# Patient Record
Sex: Female | Born: 1935 | Race: White | Hispanic: No | Smoking: Never smoker
Health system: Southern US, Community
[De-identification: ages and names within clinical notes are randomized; demographics above are authoritative.]

## PROBLEM LIST (undated history)

## (undated) DIAGNOSIS — F419 Anxiety disorder, unspecified: Secondary | ICD-10-CM

## (undated) DIAGNOSIS — I10 Essential (primary) hypertension: Secondary | ICD-10-CM

## (undated) HISTORY — PX: BACK SURGERY: SHX140

## (undated) HISTORY — PX: ABDOMINAL HYSTERECTOMY: SHX81

---

## 2010-11-26 ENCOUNTER — Encounter (HOSPITAL_COMMUNITY)
Admission: RE | Admit: 2010-11-26 | Discharge: 2010-11-26 | Disposition: A | Discharge status: Home / Self Care | Payer: Medicare Other | Source: Ambulatory Visit | Attending: Neurosurgery | Admitting: Neurosurgery

## 2010-11-26 ENCOUNTER — Other Ambulatory Visit: Payer: Self-pay | Admitting: Neurosurgery

## 2010-11-26 ENCOUNTER — Ambulatory Visit (HOSPITAL_COMMUNITY)
Admission: RE | Admit: 2010-11-26 | Discharge: 2010-11-26 | Disposition: A | Discharge status: Home / Self Care | Payer: Medicare Other | Source: Ambulatory Visit | Attending: Neurosurgery | Admitting: Neurosurgery

## 2010-11-26 DIAGNOSIS — Z01818 Encounter for other preprocedural examination: Secondary | ICD-10-CM | POA: Insufficient documentation

## 2010-11-26 DIAGNOSIS — I10 Essential (primary) hypertension: Secondary | ICD-10-CM

## 2010-11-26 DIAGNOSIS — Z01812 Encounter for preprocedural laboratory examination: Secondary | ICD-10-CM | POA: Insufficient documentation

## 2010-11-26 DIAGNOSIS — Z01810 Encounter for preprocedural cardiovascular examination: Secondary | ICD-10-CM | POA: Insufficient documentation

## 2010-11-26 LAB — CBC
MCH: 28.6 pg (ref 26.0–34.0)
Platelets: 221 10*3/uL (ref 150–400)
RBC: 4.37 MIL/uL (ref 3.87–5.11)
WBC: 9.2 10*3/uL (ref 4.0–10.5)

## 2010-11-26 LAB — BASIC METABOLIC PANEL
CO2: 31 mEq/L (ref 19–32)
Calcium: 9.5 mg/dL (ref 8.4–10.5)
Sodium: 142 mEq/L (ref 135–145)

## 2010-11-26 LAB — SURGICAL PCR SCREEN
MRSA, PCR: NEGATIVE
Staphylococcus aureus: POSITIVE — AB

## 2010-11-27 ENCOUNTER — Inpatient Hospital Stay (HOSPITAL_COMMUNITY): Admission: RE | Admit: 2010-11-27 | Payer: Medicare Other | Source: Ambulatory Visit | Admitting: Neurosurgery

## 2010-12-06 ENCOUNTER — Other Ambulatory Visit (HOSPITAL_COMMUNITY): Payer: Medicare Other

## 2010-12-12 ENCOUNTER — Inpatient Hospital Stay (HOSPITAL_COMMUNITY)
Admission: RE | Admit: 2010-12-12 | Discharge: 2010-12-21 | DRG: 460 | Disposition: A | Discharge status: Skilled Nursing Facility | Payer: Medicare Other | Source: Ambulatory Visit | Attending: Neurosurgery | Admitting: Neurosurgery

## 2010-12-12 ENCOUNTER — Inpatient Hospital Stay (HOSPITAL_COMMUNITY): Payer: Medicare Other

## 2010-12-12 DIAGNOSIS — I10 Essential (primary) hypertension: Secondary | ICD-10-CM | POA: Diagnosis present

## 2010-12-12 DIAGNOSIS — Q762 Congenital spondylolisthesis: Secondary | ICD-10-CM

## 2010-12-12 DIAGNOSIS — G8929 Other chronic pain: Secondary | ICD-10-CM | POA: Diagnosis present

## 2010-12-12 DIAGNOSIS — E669 Obesity, unspecified: Secondary | ICD-10-CM | POA: Diagnosis present

## 2010-12-12 DIAGNOSIS — Z01810 Encounter for preprocedural cardiovascular examination: Secondary | ICD-10-CM

## 2010-12-12 DIAGNOSIS — M5137 Other intervertebral disc degeneration, lumbosacral region: Principal | ICD-10-CM | POA: Diagnosis present

## 2010-12-12 DIAGNOSIS — J449 Chronic obstructive pulmonary disease, unspecified: Secondary | ICD-10-CM | POA: Diagnosis present

## 2010-12-12 DIAGNOSIS — F341 Dysthymic disorder: Secondary | ICD-10-CM | POA: Diagnosis present

## 2010-12-12 DIAGNOSIS — M51379 Other intervertebral disc degeneration, lumbosacral region without mention of lumbar back pain or lower extremity pain: Principal | ICD-10-CM | POA: Diagnosis present

## 2010-12-12 DIAGNOSIS — R339 Retention of urine, unspecified: Secondary | ICD-10-CM | POA: Diagnosis not present

## 2010-12-12 DIAGNOSIS — Z01812 Encounter for preprocedural laboratory examination: Secondary | ICD-10-CM

## 2010-12-12 DIAGNOSIS — Z79899 Other long term (current) drug therapy: Secondary | ICD-10-CM

## 2010-12-12 DIAGNOSIS — J4489 Other specified chronic obstructive pulmonary disease: Secondary | ICD-10-CM | POA: Diagnosis present

## 2010-12-12 DIAGNOSIS — Z880 Allergy status to penicillin: Secondary | ICD-10-CM

## 2010-12-12 DIAGNOSIS — Z87891 Personal history of nicotine dependence: Secondary | ICD-10-CM

## 2010-12-12 LAB — BASIC METABOLIC PANEL
BUN: 18 mg/dL (ref 6–23)
Creatinine, Ser: 0.63 mg/dL (ref 0.50–1.10)
GFR calc Af Amer: >60 mL/min (ref >60)
GFR calc non Af Amer: >60 mL/min (ref >60)
Glucose, Bld: 132 mg/dL — ABNORMAL HIGH (ref 70–99)
Potassium: 4 mEq/L (ref 3.5–5.1)

## 2010-12-12 LAB — CBC
HCT: 40.4 % (ref 36.0–46.0)
Hemoglobin: 13.4 g/dL (ref 12.0–15.0)
MCH: 29.4 pg (ref 26.0–34.0)
MCHC: 33.2 g/dL (ref 30.0–36.0)
MCV: 88.6 fL (ref 78.0–100.0)
RDW: 13.7 % (ref 11.5–15.5)

## 2010-12-12 LAB — TYPE AND SCREEN

## 2010-12-12 LAB — ABO/RH: ABO/RH(D): A POS

## 2010-12-14 DIAGNOSIS — IMO0002 Reserved for concepts with insufficient information to code with codable children: Secondary | ICD-10-CM

## 2010-12-14 DIAGNOSIS — M47817 Spondylosis without myelopathy or radiculopathy, lumbosacral region: Secondary | ICD-10-CM

## 2010-12-14 NOTE — Op Note (Signed)
Allison Macdonald, RIDGELY NO.:  [id number]  MEDICAL RECORD NO.:  [id number]  LOCATION:  3013                         FACILITY:  [facility]  PHYSICIAN:  Hilda Lias, M.D.   DATE OF BIRTH:  01-Nov-1926  DATE OF PROCEDURE:  12/12/2010 DATE OF DISCHARGE:                              OPERATIVE REPORT   PREOPERATIVE DIAGNOSES:  L4-L5, L5-S1 degenerative disk disease, spondylolisthesis at L4-L5, L5-S1, chronic radiculopathy, facet arthropathy.  POSTOPERATIVE DIAGNOSES:  L4-L5, L5-S1 degenerative disk disease, spondylolisthesis at L4-L5, L5-S1, chronic radiculopathy, facet arthropathy.  PROCEDURE:  Bilateral L4-L5 laminectomy and facetectomy, bilateral 4-5, 5-1 diskectomy.  Procedure was more aggressive and normal diskectomy. Interbody fusion with cages 12 x 22 at the level of 5-1 and 14 x 22 at L4-5, pedicle screws at L4, L5, S1, posterolateral arthrodesis and Vitoss plus autograft.  Cell Saver C-arm.  SURGEON:  Hilda Lias, MD  ASSISTANT:  Cristi Loron, MD  CLINICAL HISTORY:  The patient was admitted because of back pain with radiation of both legs, which had been going for many years.  Right now, she is up to the point of when she was like 4 days ago.  She had to sit because of the pain becoming tense.  X-rays showed that she has spondylolisthesis with stenosis at the level of 4-5, 5-1.  Surgery was advised.  The risk was explained prior to surgery.  PROCEDURE:  The patient was taken to the OR and after intubation, she was positioned in a prone manner.  The back was cleaned with DuraPrep. Drapes were applied.  Midline incision from L3-L4 to L5-S1 was made and muscles were retracted laterally.  We identified by x-ray, L4-L5 and L5- S1 space.  Then with the Leksell, we proceeded to 4-5 as well as the lamina.  To go into the disk space as well as total diskectomy, we removed all the way the facet of L4 and L5.  The patient had quite adhesion, lysis was  accomplished.  We entered the disk space at the L4-5 first in the right side and then the left side.  Total gross diskectomy was accomplished with removal of the endplate.  The same procedure was done at the level of 5-1.  Once this was achieved, we introduced at the level of L4-5 two cages with autograft inside.  At this level, the cage was 14 x 22 and at the L5-1 the cages were 12 x 22.  X-rays showed good position of the cages.  Then using the C-arm first in AP view and then a lateral view, we made a hole in the pedicle of L4, L5, and S1.  Prior to introducing the screws, we investigated all 4 quadrants yet to be sure that we were surrounded by bone.  Once this was achieved, we introduced six screws.  5.5 x 40 at the level of 4-5 and 5.5 x 45 at the level of S1.  X-rays showed good position of the pedicle screws.  We investigated prior to continuing with the fusion.  We investigated the L4, L5, S1 nerve roots as there was plenty of space and there was no penetration of the middle wall of the pedicles.  Then a rod was brought and first screw placed, the screw was secured with Capps.  Then a cross-link from right to left rod was used.  We went laterally and we removed the periosteum of 3-4, 4-5, and 5-1 and a mix of Vitoss and autograft was used for arthrodesis.  From then on, Valsalva maneuver was negative. The area was irrigated.  We went back again to check the foramen and there was plenty of space.  Then the wound was closed with Vicryl and Steri-Strips.          ______________________________ Hilda Lias, M.D.     EB/MEDQ  D:  12/12/2010  T:  12/12/2010  Job:  [id number]  Electronically Signed by Hilda Lias M.D. on 12/14/2010 06:22:40 PM

## 2010-12-15 ENCOUNTER — Inpatient Hospital Stay (HOSPITAL_COMMUNITY): Payer: Medicare Other

## 2010-12-16 LAB — BASIC METABOLIC PANEL
BUN: 22 mg/dL (ref 6–23)
CO2: 33 mEq/L — ABNORMAL HIGH (ref 19–32)
Chloride: 94 mEq/L — ABNORMAL LOW (ref 96–112)
Creatinine, Ser: 0.71 mg/dL (ref 0.50–1.10)
GFR calc Af Amer: >60 mL/min (ref >60)
Potassium: 3.6 mEq/L (ref 3.5–5.1)

## 2010-12-17 LAB — URINALYSIS, ROUTINE W REFLEX MICROSCOPIC
Glucose, UA: NEGATIVE mg/dL
Ketones, ur: NEGATIVE mg/dL
Leukocytes, UA: NEGATIVE
Nitrite: NEGATIVE
Specific Gravity, Urine: 1.013 (ref 1.005–1.030)
pH: 6.5 (ref 5.0–8.0)

## 2010-12-26 NOTE — Discharge Summary (Signed)
  [name], HOOK               ACCOUNT NO.:  [id number]  MEDICAL RECORD NO.:  [id number]  LOCATION:  3013                         FACILITY:  [facility]  PHYSICIAN:  Hilda Lias, M.D.   DATE OF BIRTH:  1926-09-30  DATE OF ADMISSION:  12/12/2010 DATE OF DISCHARGE:  12/21/2010                              DISCHARGE SUMMARY   ADMISSION DIAGNOSIS:  Degenerative disk disease, L4-L5, L5-S1 with spondylolisthesis and chronic radiculopathy.  FINAL DIAGNOSIS:  Degenerative disk disease, L4-L5, L5-S1 with spondylolisthesis and chronic radiculopathy.  CLINICAL HISTORY:  The patient was admitted because of back pain with radiation to both legs.  She has failed with the conservative treatment. X-rays showed degenerative disk disease with spondylolisthesis at the level of L4-L5 and L5-S1.  Laboratory at the present time within normal limits.  COURSE IN THE HOSPITAL:  The patient was taken to Surgery and L4-L5 laminectomy with fusion using cages and pedicle screws was done.  After surgery, the patient complained of quite a bit of pain.  She was quite sedated and we had to use intravenous morphine and Dilaudid. Nevertheless, from the past 2 days she had been taking Arthrotac and she is feeling much better.  She is ambulating today.  She is more awake and she is willing to go to a nursing home because she is fully aware that she can ambulate by herself at home.  CONDITION ON DISCHARGE:  Improvement.  MEDICATIONS:  She will continue taking the Percocet for pain and all previous medications from home.  DIET:  Regular.  ACTIVITY:  Not to drive and not do any lifting.  FOLLOWUP:  She will be seen by me in my office in 4 weeks or before as needed.          ______________________________ Hilda Lias, M.D.     EB/MEDQ  D:  12/21/2010  T:  12/21/2010  Job:  [id number]  Electronically Signed by Hilda Lias M.D. on 12/26/2010 07:05:29 PM

## 2012-05-31 IMAGING — CR DG LUMBAR SPINE 1V
1 series · 1 of 1 positions shown · non-contrast
Comparison: None.

CLINICAL DATA: 83-year-old female undergoing lumbar surgery.

LUMBAR SPINE - 1 VIEW

[view not recorded]
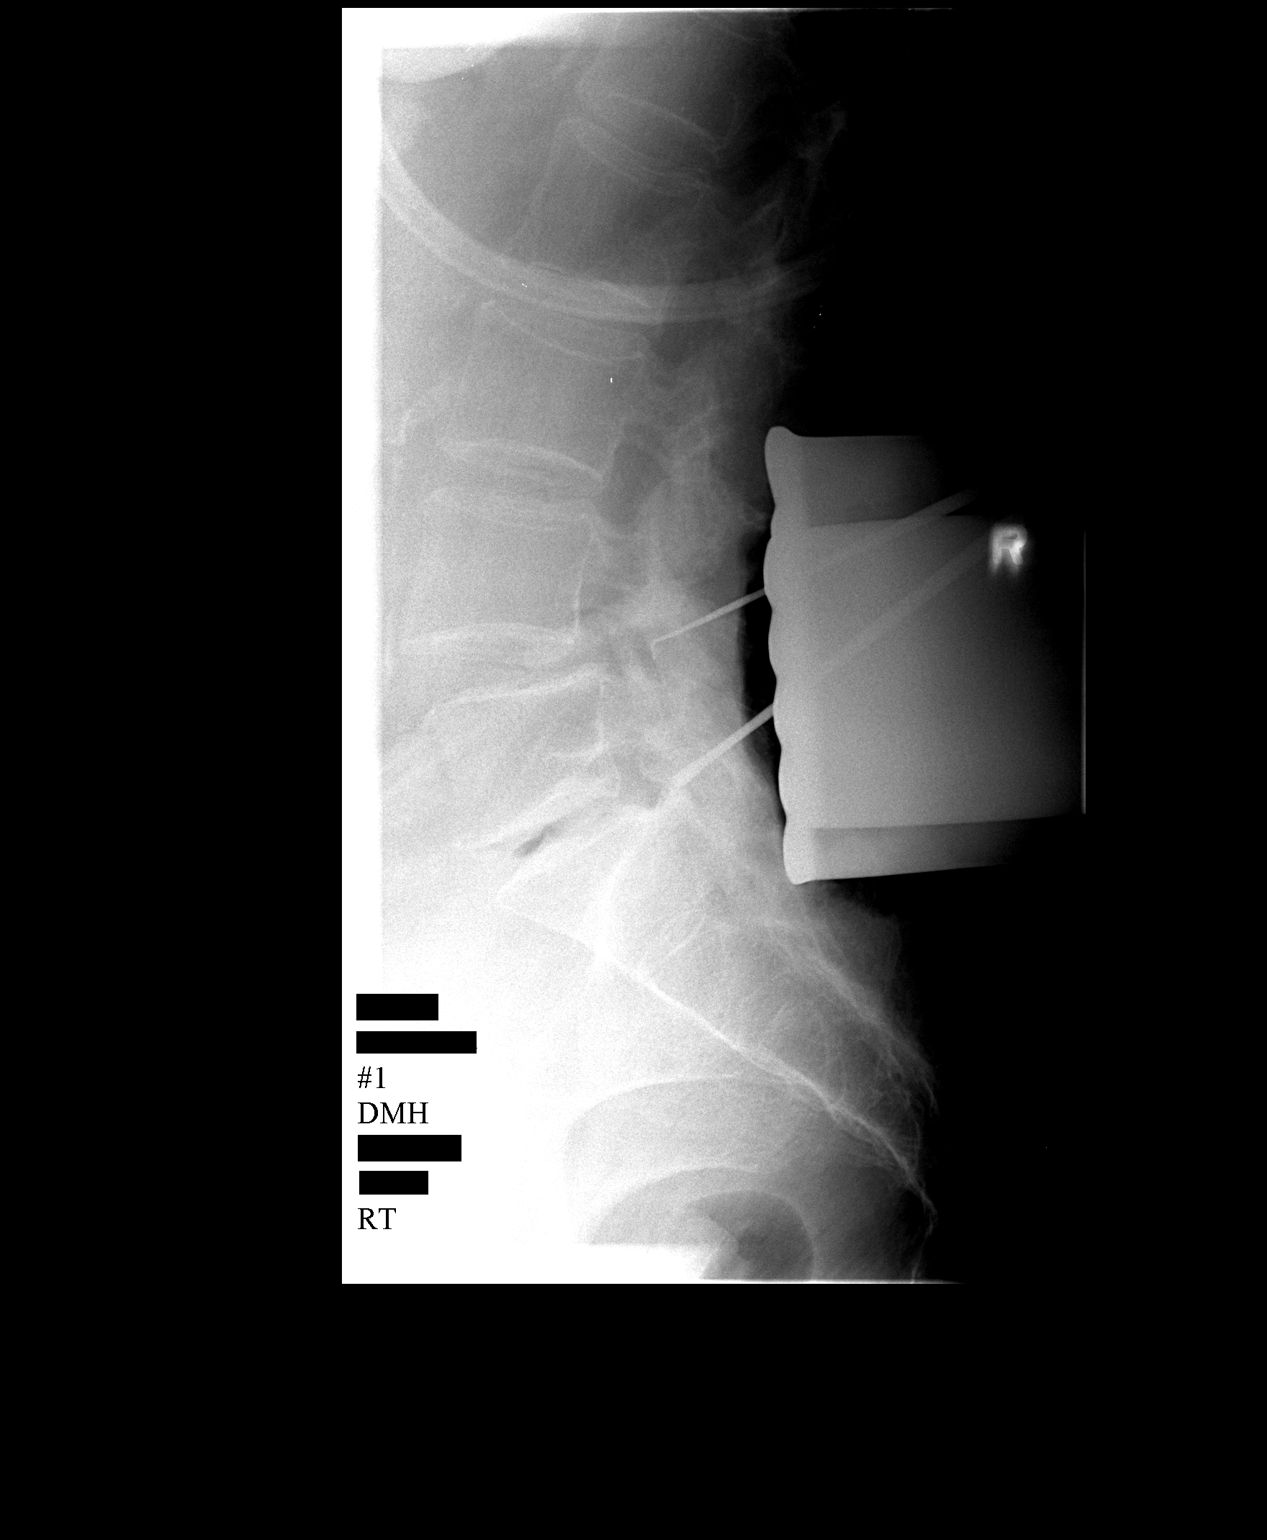

[1 of 1 positions shown; findings below may reference images not displayed]

FINDINGS: Portable cross-table lateral intraoperative view of the
lumbar spine labeled film #1 at 0770 hours.

Assuming normal lumbar segmentation, a surgical probes are present
at the L4-L5 and L5-S1 disc spaces. This designates a grade 1
anterolisthesis at the former and extensive vacuum disc phenomena
at the latter.
IMPRESSION: Intraoperative localization as above.

## 2014-09-26 ENCOUNTER — Inpatient Hospital Stay (HOSPITAL_COMMUNITY)
Admission: EM | Admit: 2014-09-26 | Discharge: 2014-09-30 | DRG: 064 | Disposition: A | Discharge status: Home Health | Payer: Medicare Other | Attending: Internal Medicine | Admitting: Internal Medicine

## 2014-09-26 ENCOUNTER — Other Ambulatory Visit (HOSPITAL_COMMUNITY): Payer: Self-pay

## 2014-09-26 ENCOUNTER — Emergency Department (HOSPITAL_COMMUNITY): Payer: Medicare Other

## 2014-09-26 ENCOUNTER — Encounter (HOSPITAL_COMMUNITY): Payer: Self-pay | Admitting: *Deleted

## 2014-09-26 ENCOUNTER — Inpatient Hospital Stay (HOSPITAL_COMMUNITY): Payer: Medicare Other

## 2014-09-26 DIAGNOSIS — G8929 Other chronic pain: Secondary | ICD-10-CM | POA: Diagnosis present

## 2014-09-26 DIAGNOSIS — I63531 Cerebral infarction due to unspecified occlusion or stenosis of right posterior cerebral artery: Secondary | ICD-10-CM | POA: Diagnosis present

## 2014-09-26 DIAGNOSIS — Z7982 Long term (current) use of aspirin: Secondary | ICD-10-CM

## 2014-09-26 DIAGNOSIS — M549 Dorsalgia, unspecified: Secondary | ICD-10-CM | POA: Diagnosis present

## 2014-09-26 DIAGNOSIS — I5181 Takotsubo syndrome: Secondary | ICD-10-CM

## 2014-09-26 DIAGNOSIS — K5900 Constipation, unspecified: Secondary | ICD-10-CM | POA: Diagnosis not present

## 2014-09-26 DIAGNOSIS — R778 Other specified abnormalities of plasma proteins: Secondary | ICD-10-CM | POA: Diagnosis present

## 2014-09-26 DIAGNOSIS — E669 Obesity, unspecified: Secondary | ICD-10-CM | POA: Diagnosis present

## 2014-09-26 DIAGNOSIS — I4891 Unspecified atrial fibrillation: Secondary | ICD-10-CM | POA: Diagnosis present

## 2014-09-26 DIAGNOSIS — R531 Weakness: Secondary | ICD-10-CM | POA: Diagnosis present

## 2014-09-26 DIAGNOSIS — F0390 Unspecified dementia without behavioral disturbance: Secondary | ICD-10-CM | POA: Diagnosis present

## 2014-09-26 DIAGNOSIS — I639 Cerebral infarction, unspecified: Secondary | ICD-10-CM | POA: Diagnosis not present

## 2014-09-26 DIAGNOSIS — R110 Nausea: Secondary | ICD-10-CM | POA: Diagnosis not present

## 2014-09-26 DIAGNOSIS — J9601 Acute respiratory failure with hypoxia: Secondary | ICD-10-CM | POA: Diagnosis not present

## 2014-09-26 DIAGNOSIS — R0600 Dyspnea, unspecified: Secondary | ICD-10-CM

## 2014-09-26 DIAGNOSIS — W01198A Fall on same level from slipping, tripping and stumbling with subsequent striking against other object, initial encounter: Secondary | ICD-10-CM | POA: Diagnosis present

## 2014-09-26 DIAGNOSIS — R748 Abnormal levels of other serum enzymes: Secondary | ICD-10-CM | POA: Diagnosis present

## 2014-09-26 DIAGNOSIS — F411 Generalized anxiety disorder: Secondary | ICD-10-CM | POA: Diagnosis not present

## 2014-09-26 DIAGNOSIS — I5043 Acute on chronic combined systolic (congestive) and diastolic (congestive) heart failure: Secondary | ICD-10-CM | POA: Diagnosis present

## 2014-09-26 DIAGNOSIS — J441 Chronic obstructive pulmonary disease with (acute) exacerbation: Secondary | ICD-10-CM | POA: Diagnosis not present

## 2014-09-26 DIAGNOSIS — I483 Typical atrial flutter: Secondary | ICD-10-CM | POA: Diagnosis not present

## 2014-09-26 DIAGNOSIS — R7989 Other specified abnormal findings of blood chemistry: Secondary | ICD-10-CM | POA: Diagnosis not present

## 2014-09-26 DIAGNOSIS — J9600 Acute respiratory failure, unspecified whether with hypoxia or hypercapnia: Secondary | ICD-10-CM | POA: Diagnosis present

## 2014-09-26 DIAGNOSIS — Z6831 Body mass index (BMI) 31.0-31.9, adult: Secondary | ICD-10-CM

## 2014-09-26 DIAGNOSIS — R29810 Facial weakness: Secondary | ICD-10-CM | POA: Diagnosis present

## 2014-09-26 DIAGNOSIS — I4892 Unspecified atrial flutter: Secondary | ICD-10-CM | POA: Diagnosis present

## 2014-09-26 DIAGNOSIS — F329 Major depressive disorder, single episode, unspecified: Secondary | ICD-10-CM | POA: Diagnosis present

## 2014-09-26 DIAGNOSIS — R9431 Abnormal electrocardiogram [ECG] [EKG]: Secondary | ICD-10-CM

## 2014-09-26 DIAGNOSIS — E876 Hypokalemia: Secondary | ICD-10-CM | POA: Diagnosis not present

## 2014-09-26 DIAGNOSIS — S0512XA Contusion of eyeball and orbital tissues, left eye, initial encounter: Secondary | ICD-10-CM | POA: Diagnosis present

## 2014-09-26 DIAGNOSIS — I5023 Acute on chronic systolic (congestive) heart failure: Secondary | ICD-10-CM | POA: Clinically undetermined

## 2014-09-26 DIAGNOSIS — I484 Atypical atrial flutter: Secondary | ICD-10-CM | POA: Diagnosis not present

## 2014-09-26 DIAGNOSIS — R1312 Dysphagia, oropharyngeal phase: Secondary | ICD-10-CM | POA: Diagnosis present

## 2014-09-26 DIAGNOSIS — Z880 Allergy status to penicillin: Secondary | ICD-10-CM

## 2014-09-26 DIAGNOSIS — J9811 Atelectasis: Secondary | ICD-10-CM | POA: Diagnosis present

## 2014-09-26 DIAGNOSIS — I509 Heart failure, unspecified: Secondary | ICD-10-CM | POA: Diagnosis not present

## 2014-09-26 DIAGNOSIS — H5340 Unspecified visual field defects: Secondary | ICD-10-CM | POA: Diagnosis present

## 2014-09-26 DIAGNOSIS — I10 Essential (primary) hypertension: Secondary | ICD-10-CM | POA: Diagnosis not present

## 2014-09-26 DIAGNOSIS — R1314 Dysphagia, pharyngoesophageal phase: Secondary | ICD-10-CM | POA: Diagnosis not present

## 2014-09-26 HISTORY — DX: Essential (primary) hypertension: I10

## 2014-09-26 HISTORY — DX: Anxiety disorder, unspecified: F41.9

## 2014-09-26 LAB — COMPREHENSIVE METABOLIC PANEL
ALT: 31 U/L (ref 14–54)
AST: 39 U/L (ref 15–41)
Albumin: 3.9 g/dL (ref 3.5–5.0)
Alkaline Phosphatase: 113 U/L (ref 38–126)
Anion gap: 12 (ref 5–15)
BUN: 20 mg/dL (ref 6–20)
CO2: 23 mmol/L (ref 22–32)
Calcium: 9.1 mg/dL (ref 8.9–10.3)
Chloride: 103 mmol/L (ref 101–111)
Creatinine, Ser: 1.1 mg/dL — ABNORMAL HIGH (ref 0.44–1.00)
GFR calc Af Amer: 52 mL/min — ABNORMAL LOW (ref >60)
GFR calc non Af Amer: 44 mL/min — ABNORMAL LOW (ref >60)
Glucose, Bld: 137 mg/dL — ABNORMAL HIGH (ref 65–99)
Potassium: 4.1 mmol/L (ref 3.5–5.1)
Sodium: 138 mmol/L (ref 135–145)
Total Bilirubin: 1.1 mg/dL (ref 0.3–1.2)
Total Protein: 7.2 g/dL (ref 6.5–8.1)

## 2014-09-26 LAB — DIFFERENTIAL
Basophils Absolute: 0 10*3/uL (ref 0.0–0.1)
Basophils Relative: 0 % (ref 0–1)
Eosinophils Absolute: 0.3 10*3/uL (ref 0.0–0.7)
Eosinophils Relative: 2 % (ref 0–5)
Lymphocytes Relative: 40 % (ref 12–46)
Lymphs Abs: 4.2 10*3/uL — ABNORMAL HIGH (ref 0.7–4.0)
Monocytes Absolute: 0.5 10*3/uL (ref 0.1–1.0)
Monocytes Relative: 5 % (ref 3–12)
Neutro Abs: 5.5 10*3/uL (ref 1.7–7.7)
Neutrophils Relative %: 53 % (ref 43–77)

## 2014-09-26 LAB — CBC
HCT: 39.2 % (ref 36.0–46.0)
Hemoglobin: 12.9 g/dL (ref 12.0–15.0)
MCH: 30.6 pg (ref 26.0–34.0)
MCHC: 32.9 g/dL (ref 30.0–36.0)
MCV: 93.1 fL (ref 78.0–100.0)
Platelets: 229 10*3/uL (ref 150–400)
RBC: 4.21 MIL/uL (ref 3.87–5.11)
RDW: 14.1 % (ref 11.5–15.5)
WBC: 10.5 10*3/uL (ref 4.0–10.5)

## 2014-09-26 LAB — I-STAT CHEM 8, ED
BUN: 22 mg/dL — ABNORMAL HIGH (ref 6–20)
Calcium, Ion: 1.06 mmol/L — ABNORMAL LOW (ref 1.13–1.30)
Chloride: 102 mmol/L (ref 101–111)
Creatinine, Ser: 1 mg/dL (ref 0.44–1.00)
Glucose, Bld: 142 mg/dL — ABNORMAL HIGH (ref 65–99)
HCT: 43 % (ref 36.0–46.0)
Hemoglobin: 14.6 g/dL (ref 12.0–15.0)
Potassium: 4 mmol/L (ref 3.5–5.1)
Sodium: 140 mmol/L (ref 135–145)
TCO2: 23 mmol/L (ref 0–100)

## 2014-09-26 LAB — I-STAT TROPONIN, ED: Troponin i, poc: 0.65 ng/mL (ref 0.00–0.08)

## 2014-09-26 LAB — URINALYSIS, ROUTINE W REFLEX MICROSCOPIC
Glucose, UA: NEGATIVE mg/dL
Ketones, ur: 15 mg/dL — AB
Leukocytes, UA: NEGATIVE
Nitrite: NEGATIVE
Protein, ur: 100 mg/dL — AB
Specific Gravity, Urine: 1.033 — ABNORMAL HIGH (ref 1.005–1.030)
Urobilinogen, UA: 1 mg/dL (ref 0.0–1.0)
pH: 5 (ref 5.0–8.0)

## 2014-09-26 LAB — RAPID URINE DRUG SCREEN, HOSP PERFORMED
Amphetamines: NOT DETECTED
Barbiturates: NOT DETECTED
Benzodiazepines: POSITIVE — AB
Cocaine: NOT DETECTED
Opiates: POSITIVE — AB
Tetrahydrocannabinol: NOT DETECTED

## 2014-09-26 LAB — URINE MICROSCOPIC-ADD ON

## 2014-09-26 LAB — PROTIME-INR
INR: 1.19 (ref 0.00–1.49)
Prothrombin Time: 15.2 seconds (ref 11.6–15.2)

## 2014-09-26 LAB — ETHANOL: Alcohol, Ethyl (B): <5 mg/dL (ref <5)

## 2014-09-26 LAB — APTT: aPTT: 30 seconds (ref 24–37)

## 2014-09-26 LAB — TROPONIN I: TROPONIN I: 0.47 ng/mL — AB (ref <0.031)

## 2014-09-26 LAB — CBG MONITORING, ED: Glucose-Capillary: 146 mg/dL — ABNORMAL HIGH (ref 65–99)

## 2014-09-26 MED ORDER — HEPARIN (PORCINE) IN NACL 100-0.45 UNIT/ML-% IJ SOLN
1200.0000 [IU]/h | INTRAMUSCULAR | Status: DC
  Administered 2014-09-26: 1050 [IU]/h via INTRAVENOUS
  Administered 2014-09-27: 1200 [IU]/h via INTRAVENOUS
  Filled 2014-09-26 (×4): qty 250

## 2014-09-26 MED ORDER — SENNOSIDES-DOCUSATE SODIUM 8.6-50 MG PO TABS: 1.0000 | ORAL_TABLET | Freq: Every evening | ORAL | Status: DC | PRN

## 2014-09-26 MED ORDER — ONDANSETRON HCL 4 MG/2ML IJ SOLN
4.0000 mg | Freq: Once | INTRAMUSCULAR | Status: AC
  Administered 2014-09-26: 4 mg via INTRAVENOUS
  Filled 2014-09-26: qty 2

## 2014-09-26 MED ORDER — ASPIRIN 325 MG PO TABS
325.0000 mg | ORAL_TABLET | Freq: Every day | ORAL | Status: DC
  Administered 2014-09-26 – 2014-09-29 (×3): 325 mg via ORAL
  Filled 2014-09-26 (×3): qty 1

## 2014-09-26 MED ORDER — STROKE: EARLY STAGES OF RECOVERY BOOK
Freq: Once | Status: AC
  Administered 2014-09-26: 22:00:00

## 2014-09-26 MED ORDER — LIDOCAINE HCL (PF) 1 % IJ SOLN: 5.0000 mL | Freq: Once | INTRAMUSCULAR | Status: DC

## 2014-09-26 MED ORDER — ONDANSETRON HCL 4 MG/2ML IJ SOLN
4.0000 mg | INTRAMUSCULAR | Status: DC | PRN
  Administered 2014-09-26 – 2014-09-30 (×6): 4 mg via INTRAVENOUS
  Filled 2014-09-26 (×5): qty 2

## 2014-09-26 MED ORDER — ASPIRIN 81 MG PO CHEW
324.0000 mg | CHEWABLE_TABLET | Freq: Once | ORAL | Status: AC
  Administered 2014-09-26: 324 mg via ORAL
  Filled 2014-09-26: qty 4

## 2014-09-26 MED ORDER — SODIUM CHLORIDE 0.9 % IV SOLN
INTRAVENOUS | Status: AC
  Administered 2014-09-26: 22:00:00 via INTRAVENOUS

## 2014-09-26 MED ORDER — HEPARIN BOLUS VIA INFUSION
4000.0000 [IU] | Freq: Once | INTRAVENOUS | Status: DC
  Filled 2014-09-26: qty 4000

## 2014-09-26 MED ORDER — ASPIRIN 300 MG RE SUPP
300.0000 mg | Freq: Every day | RECTAL | Status: DC
Start: 2014-09-26 — End: 2014-09-29
  Administered 2014-09-27: 300 mg via RECTAL
  Filled 2014-09-26 (×3): qty 1

## 2014-09-26 NOTE — Consult Note (Addendum)
Admit date: 09/26/2014 Referring Physician  Dr. Juleen China Primary Physician No primary care provider on file. Primary Cardiologist  Dr.Kowolski [place] Reason for Consultation  Elevated troponin, abnormal EKG with deep T-wave inversion, left facial droop consistent with stroke  HPI: 81 year old female with hypertension, no prior known cardiovascular history with dementia who presented as code stroke with left facial droop noticed around 5:15 PM not clear when last normal period CT scan of head showed no evidence of bleed. Her son took her in because he thought she was having a stroke. She states that she thinks that she fell asleep with her head on the side of the chair and that may have made it go "numb". Neurology evaluated her when she arrived and mild deficits were visualized. Unaware of onset. She was started on aspirin and heparin. She was not deemed a TPA candidate.  Her EKG demonstrates atrial flutter with deep T-wave inversions especially in the precordial leads, no evidence of ST elevation. T-wave inversions are also seen in the inferolateral leads as well as they're fairly diffuse. The magnitude of T-wave inversions aren't quite significant especially in V3, V4. She is not complaining of any chest discomfort, shortness of breath.  Prior back surgery by Dr. Jeral Fruit in 2012. Has chronic back pain.  Currently asking to go home.    PMH:   Past Medical History  Diagnosis Date  . Anxiety   . Hypertension     PSH:   Past Surgical History  Procedure Laterality Date  . Back surgery    . Abdominal hysterectomy     Allergies:  Penicillins Prior to Admit Meds:   Prior to Admission medications   Not on File   Fam HX:   No early family history of coronary artery disease elicited. Her father lived to age 10. He had a garden until the day he died.  Social HX:    History   Social History  . Marital Status: Widowed    Spouse Name: N/A  . Number of Children: N/A  . Years of  Education: N/A   Occupational History  . Not on file.   Social History Main Topics  . Smoking status: Never Smoker   . Smokeless tobacco: Not on file  . Alcohol Use: No  . Drug Use: No  . Sexual Activity: Not on file   Other Topics Concern  . Not on file   Social History Narrative  . No narrative on file     ROS: No bleeding no syncope. She has had a recent fall, above left eye hematoma. All 11 ROS were addressed and are negative except what is stated in the HPI   Physical Exam: Blood pressure 121/87, pulse 67, temperature 98 F (36.7 C), temperature source Oral, resp. rate 18, height 5' 3.5" (1.613 m), weight 193 lb 2 oz (87.6 kg), SpO2 96 %.   General: Elderly, in no acute distress Head: Eyes PERRLA, No xanthomas.  Above left eye hematoma from recent fall in the last few weeks.  Lungs:   Clear bilaterally to auscultation and percussion. Normal respiratory effort. No wheezes, no rales. Heart:   HRRR S1 S2 occasional ectopy diminished pulses in distal extremities. No murmur, rubs, gallops.  No obvious carotid bruit. No JVD.  No abdominal bruits.  Abdomen: Bowel sounds are positive, abdomen soft and non-tender without masses. No hepatosplenomegaly. Msk:  Back normal. Normal strength and tone for age. Extremities:  No clubbing, cyanosis or edema.  DP diminished Neuro: Answers  questions fairly appropriately. Left facial droop very minimal if any at this point. Good grip strength bilaterally. GU: Deferred Rectal: Deferred Psych:  Good affect, responds appropriately      Labs: Lab Results  Component Value Date   WBC 10.5 09/26/2014   HGB 14.6 09/26/2014   HCT 43.0 09/26/2014   MCV 93.1 09/26/2014   PLT 229 09/26/2014     Recent Labs Lab 09/26/14 1847 09/26/14 1854  NA 138 140  K 4.1 4.0  CL 103 102  CO2 23  --   BUN 20 22*  CREATININE 1.10* 1.00  CALCIUM 9.1  --   PROT 7.2  --   BILITOT 1.1  --   ALKPHOS 113  --   ALT 31  --   AST 39  --   GLUCOSE 137*  142*     Radiology:  Ct Head Wo Contrast  09/26/2014   CLINICAL DATA:  Left facial droop.  EXAM: CT HEAD WITHOUT CONTRAST  TECHNIQUE: Contiguous axial images were obtained from the base of the skull through the vertex without intravenous contrast.  COMPARISON:  None.  FINDINGS: Chronic microvascular ischemic change is noted. No evidence of acute intracranial abnormality including hemorrhage, infarct, mass lesion, mass effect, midline shift or abnormal extra-axial fluid collection is seen. No hydrocephalus or pneumocephalus. The calvarium is intact. Imaged paranasal sinuses demonstrate a very small mucous retention cyst or polyp in the right maxillary.  IMPRESSION: No acute abnormality.  Chronic microvascular ischemic change.   Electronically Signed   By: Drusilla Kanner M.D.   On: 09/26/2014 18:59   Personally viewed.  EKG:  Atrial flutter with deeply inverted T waves especially precordial leads but diffusely. No ST elevation. Personally viewed. EKG from 11/27/2010 demonstrated sinus rhythm with first degree AV block, PACs   ASSESSMENT/PLAN:    81 year old female with left facial droop evaluated by neurology as code stroke initially, not a TPA candidate whose EKG demonstrated atrial flutter with significantly and deeply inverted T waves especially in precordial leads but diffusely with no chest pain, troponin point-of-care elevation of 0.65.  1. Abnormal EKG-EKG could be consistent with diffuse ischemia however deeply inverted T waves may be suggestive of acute neurologic derangement such as stroke. I will defer to neurology's recommendation whether or not heparin can be started. Her CT of head showed no evidence of bleed however if she is or has had an acute stroke, IV heparin risks of hemorrhagic transformation must be weighed. She has received aspirin. I discussed this with Dr. Juleen China in the emergency room who discussed with neurology and they were okay with starting IV heparin. I will order  echocardiogram which seems reasonable to assess her overall left ventricular function. I discussed with her that we would not likely be proceeding with any invasive management strategy, i.e. diagnostic cardiac catheterization, and she was in agreement. If she remains symptom-free, one could consider continued medical management without any further cardiac testing. Recommend repeat EKG in a.m.  2. Elevated troponin-0.65 point-of-care. Continue to cycle biomarkers. Low level troponin could be seen as well in the setting of stroke. Once again she is not complaining of any anginal-like symptoms. No shortness of breath. I would expect that she would be having some sort of anginal symptom if this were demonstrative of global ischemia however this may be atypical. Elevated troponin does portend a worsened prognosis.  3. Atrial flutter-given her age, history of hypertension, possible stroke, recommendation would be to place on anticoagulation. However, given her recent fall,  possible memory impairment although she seemed to answer questions fairly appropriately, she may not be a good long-term anticoagulation candidate. There is no indication for cardioversion. She is asymptomatic with regards to her atrial flutter. She is currently rate controlled.  4. Possible stroke-neurology evaluation has taken place. Dr. Juleen China has discussed with neurology. Her left facial droop seems very minimal if at all at this point. She seems fairly nonfocal. Possible TIA. Defer to neurology for diagnosis.  5. Essential hypertension-per primary team.  We will follow along with you.  Donato Schultz, MD  09/26/2014  7:56 PM

## 2014-09-26 NOTE — ED Notes (Signed)
Dr Juleen China notified of elevated troponin and is at bedside.

## 2014-09-26 NOTE — ED Notes (Signed)
CBG 146 

## 2014-09-26 NOTE — ED Notes (Signed)
Attempted to call family.

## 2014-09-26 NOTE — ED Notes (Signed)
Family called [facility] for L facial droop that they noticed at 1715.  Difficult to assess LSN at this point.

## 2014-09-26 NOTE — Progress Notes (Signed)
ANTICOAGULATION CONSULT NOTE Pharmacy Consult for heparin Indication: chest pain/ACS  Allergies  Allergen Reactions  . Penicillins     Patient Measurements: Height: 5' 3.5" (161.3 cm) Weight: 193 lb 2 oz (87.6 kg) IBW/kg (Calculated) : 53.55 Heparin Dosing Weight: 87.6 kg  Vital Signs: Temp: 97.8 F (36.6 C) (06/13 1857) Temp Source: Oral (06/13 1857) BP: 121/87 mmHg (06/13 1857) Pulse Rate: 67 (06/13 1857)  Labs:  Recent Labs  09/26/14 1847 09/26/14 1854  HGB 12.9 14.6  HCT 39.2 43.0  PLT 229  --   APTT 30  --   LABPROT 15.2  --   INR 1.19  --   CREATININE 1.10* 1.00    Estimated Creatinine Clearance: 43.6 mL/min (by C-G formula based on Cr of 1).   Medical History: Past Medical History  Diagnosis Date  . Anxiety   . Hypertension     Medications:   (Not in a hospital admission)  Assessment: 81 yo female initially presented with s/sx of stroke, found to have minimal deficits and was not given tpa. Pt was noted to have an abnormal EKG and positive initial troponin therefore heparin will be initiated while cardiology evaluation is ongoing. CBC stable.   Goal of Therapy:  Heparin level 0.3-0.7 units/ml Monitor platelets by anticoagulation protocol: Yes   Plan:  Heparin 1050 units/hr - no bolus given possible stroke Daily HL, CBC Monitor for s/sx bleeding  Agapito Games, PharmD, BCPS Clinical Pharmacist Pager: [phone] 09/26/2014 7:37 PM

## 2014-09-26 NOTE — Progress Notes (Signed)
Pt transferred to unit from ED via NT x 1. Pt alert and oriented upon arrival. No complaints of pain or discomfort. No signs or symptoms of acute distress. Pt connected to telemetry and central monitoring notified. Pt oriented to unit, as well unit procedures. Pt now resting in bed at lowest position, bed alarm on, call light in reach. Will continue to monitor. Delfino Lovett, RN, BSN 09/26/2014 10:24 PM

## 2014-09-26 NOTE — H&P (Signed)
[facility] History and Physical  Allison Macdonald AVW:[id number] DOB: 01/02/1927 DOA: 09/26/2014  Referring physician: Dr.Kohut. PCP: No primary care provider on file. In [place]. Specialists: None.  Chief Complaint: Left facial droop and confusion.  HPI: Allison Macdonald is a 81 y.o. female with history of hypertension and COPD and chronic back pain was brought to the ER after patient was found to be having increasing confusion and left facial droop. As per patient's husband patient became suddenly confused with left facial droop around 5:10 PM this evening. Patient's husband called EMS and patient was brought to the ER. After reaching ER patient became oriented but left facial droop persisted and on-call neurologist Dr. Amada Jupiter was consulted for acute stroke and CT head did not show anything acute and on exam patient had left facial droop with some left-sided weakness. Patient also had a fall 2 days ago which patient states she tripped and fell and has a small left peri-orbital hematoma. Patient's EKG shows atrial flutter with deep wave inversion. Troponin is mildly elevated and on call cardiologist Dr. Anne Fu also consulted. Patient denies any chest pain or shortness of breath diaphoresis. Patient denies any visual symptoms difficulty swallowing or speaking.  Review of Systems: As presented in the history of presenting illness, rest negative.  Past Medical History  Diagnosis Date  . Anxiety   . Hypertension    Past Surgical History  Procedure Laterality Date  . Back surgery    . Abdominal hysterectomy     Social History:  reports that she has never smoked. She does not have any smokeless tobacco history on file. She reports that she does not drink alcohol or use illicit drugs. Where does patient live at home. Can patient participate in ADLs? Yes.  Allergies  Allergen Reactions  . Penicillins     Family History: History reviewed. No pertinent family history.    Prior  to Admission medications   Not on File    Physical Exam: Filed Vitals:   09/26/14 1857 09/26/14 1933  BP: 121/87   Pulse: 67   Temp: 97.8 F (36.6 C) 98 F (36.7 C)  TempSrc: Oral   Resp: 18   Height: 5' 3.5" (1.613 m)   Weight: 87.6 kg (193 lb 2 oz)   SpO2: 96%      General:  Moderately built and nourished.  Eyes: Anicteric no pallor. Small left periorbital hematoma from recent fall.  ENT: No discharge from the ears eyes nose and mouth.  Neck: No mass felt.  Cardiovascular: S1-S2 heard.  Respiratory: No rhonchi or crepitations.  Abdomen: Soft nontender bowel sounds present.  Skin: Small left periorbital hematoma.  Musculoskeletal: No edema.  Psychiatric: Appears normal.  Neurologic: Alert awake oriented to time place and person. Left facial droop. Moves all extremities with mild left-sided weakness. Perla positive. Tongue is midline.  Labs on Admission:  Basic Metabolic Panel:  Recent Labs Lab 09/26/14 1847 09/26/14 1854  NA 138 140  K 4.1 4.0  CL 103 102  CO2 23  --   GLUCOSE 137* 142*  BUN 20 22*  CREATININE 1.10* 1.00  CALCIUM 9.1  --    Liver Function Tests:  Recent Labs Lab 09/26/14 1847  AST 39  ALT 31  ALKPHOS 113  BILITOT 1.1  PROT 7.2  ALBUMIN 3.9   No results for input(s): LIPASE, AMYLASE in the last 168 hours. No results for input(s): AMMONIA in the last 168 hours. CBC:  Recent Labs Lab 09/26/14 1847 09/26/14 1854  WBC 10.5  --   NEUTROABS 5.5  --   HGB 12.9 14.6  HCT 39.2 43.0  MCV 93.1  --   PLT 229  --    Cardiac Enzymes: No results for input(s): CKTOTAL, CKMB, CKMBINDEX, TROPONINI in the last 168 hours.  BNP (last 3 results) No results for input(s): BNP in the last 8760 hours.  ProBNP (last 3 results) No results for input(s): PROBNP in the last 8760 hours.  CBG:  Recent Labs Lab 09/26/14 1910  GLUCAP 146*    Radiological Exams on Admission: Ct Head Wo Contrast  09/26/2014   CLINICAL DATA:  Left  facial droop.  EXAM: CT HEAD WITHOUT CONTRAST  TECHNIQUE: Contiguous axial images were obtained from the base of the skull through the vertex without intravenous contrast.  COMPARISON:  None.  FINDINGS: Chronic microvascular ischemic change is noted. No evidence of acute intracranial abnormality including hemorrhage, infarct, mass lesion, mass effect, midline shift or abnormal extra-axial fluid collection is seen. No hydrocephalus or pneumocephalus. The calvarium is intact. Imaged paranasal sinuses demonstrate a very small mucous retention cyst or polyp in the right maxillary.  IMPRESSION: No acute abnormality.  Chronic microvascular ischemic change.   Electronically Signed   By: Drusilla Kanner M.D.   On: 09/26/2014 18:59    EKG: Independently reviewed. Atrial flutter with deep T-wave inversions.  Assessment/Plan Principal Problem:   Stroke Active Problems:   Hypertension   Elevated troponin   History of COPD   Atrial flutter   1. Stroke - patient's symptoms are concerning for stroke and at this time patient has been placed on neurochecks swallow evaluation and MRI/MRA brain, 2-D echo carotid Doppler hemoglobin A1c and lipid panel has been ordered. Patient is on aspirin. Patient also was started on heparin for elevated troponin and I have discussed with on-call neurologist Dr. Hosie Poisson who at this time advised it is okay to continue with respect to patient's elevated troponin. Patient also has atrial flutter for which patient may eventually need anticoagulation. Physical therapy consult. 2. Elevated troponin - appreciated cardiology consult. Patient denies any chest pain. At this time patient has been placed on heparin infusion. Cycle cardiac markers check 2-D echo and patient is on anti-platelet agents. 3. Atrial flutter rate controlled - patient may not be a long-term candidate for anticoagulation given patient's recent falls presently patient is on heparin which was mainly started for elevated  troponin. 4. Hypertension - allow for permissive hypertension at this time given possible stroke. Home dose of antihypertensives to be verified. 5. COPD present in or wheezing. 6. Chronic back pain. 7. Small left periorbital hematoma - status post recent fall. Closely observe.   DVT Prophylaxisheparin infusion.  Code Status: Full code.  Family Communication: Patient's husband.  Disposition Plan: Admitted to inpatient.    Bernard Slayden N. [facility] Pager [phone].  If 7PM-7AM, please contact night-coverage [email or web address] Password [facility] 09/26/2014, 8:26 PM

## 2014-09-26 NOTE — Consult Note (Signed)
Neurology Consultation Reason for Consult: left sided weakness Referring Physician: Juleen China, S  CC: Left sided weakness  History is obtained from:patient  HPI: Allison Macdonald is a 81 y.o. female with a history of htn who was seen to have left facial droop at 5:10 pm. It was unclear exactly when she was last known to be normal as the patient was not aware of her deficits. She was brought in as a code stroke and CT was negative. She has mild left sided weakness, but was not a candidate for tpa both due to mild deficits, but alos unclear time of onset.    LKW: unclear, possibly around 5:10pm.  tpa given?: no, out of window.     ROS: A 14 point ROS was performed and is negative except as noted in the HPI.   Past Medical History  Diagnosis Date  . Anxiety   . Hypertension     Family History: stroke  Social History: Tob: deneis  Exam: Current vital signs: BP 121/87 mmHg  Pulse 67  Temp(Src) 98 F (36.7 C) (Oral)  Resp 18  Ht 5' 3.5" (1.613 m)  Wt 87.6 kg (193 lb 2 oz)  BMI 33.67 kg/m2  SpO2 96% Vital signs in last 24 hours: Temp:  [97.8 F (36.6 C)-98 F (36.7 C)] 98 F (36.7 C) (06/13 1933) Pulse Rate:  [67] 67 (06/13 1857) Resp:  [18] 18 (06/13 1857) BP: (121)/(87) 121/87 mmHg (06/13 1857) SpO2:  [96 %] 96 % (06/13 1857) Weight:  [87.6 kg (193 lb 2 oz)] 87.6 kg (193 lb 2 oz) (06/13 1857)   Physical Exam  Constitutional: Appears well-developed and well-nourished.  Psych: Affect appropriate to situation Eyes: No scleral injection HENT: No OP obstrucion Head: Normocephalic.  Cardiovascular: Normal rate and regular rhythm.  Respiratory: Effort normal and breath sounds normal to anterior ascultation GI: Soft.  Previous scars  Skin: WDI  Neuro: Mental Status: Patient is awake, alert, oriented to person, place, month, year, and situation. Patient is able to give a clear and coherent history. No signs of aphasia or neglect Cranial Nerves: II: Visual Fields are  full. Pupils are equal, round, and reactive to light.   III,IV, VI: EOMI without ptosis or diploplia.  V: Facial sensation is symmetric to temperature VII: Facial movement is notable for left facial droop VIII: hearing is intact to voice X: Uvula elevates symmetrically XI: Shoulder shrug is symmetric. XII: tongue deviates to the left Motor: Tone is normal. Bulk is normal. 5/5 strength was present on the right, no drift on left, but mild decrease in dexterity and grip strength.  Sensory: Sensation is symmetric to light touch and temperature in the arms and legs. Deep Tendon Reflexes: 2+ and symmetric in the biceps and patellae.  Plantars: Toes are downgoing bilaterally.  Cerebellar: Intention tremor bilaterally  No extinction to visual or tactile stimuli.        I have reviewed labs in epic and the results pertinent to this consultation are: Chem 8 unremarkabl  I have reviewed the images obtained:CT head - negative  Impression: 81 yo F with new left sided weakness. Her symptoms are mild and unclear exactly when they started and therefore I think the risk of tpa outweighs the expected benefit. I do suspect a small infarct.   Recommendations: 1. HgbA1c, fasting lipid panel 2. MRI, MRA  of the brain without contrast 3. Frequent neuro checks 4. Echocardiogram 5. Carotid dopplers 6. Prophylactic therapy-Antiplatelet med: Aspirin - dose 325mg  PO or 300mg   PR 7. Risk factor modification 8. Telemetry monitoring 9. PT consult, OT consult, Speech consult    Ritta Slot, MD [facility] [phone]  If 7pm- 7am, please page neurology on call as listed in AMION.

## 2014-09-26 NOTE — Code Documentation (Signed)
81 year old presents to [facility] as code stroke from [facility].  Family noticed different at 1715 tonight - unable to reach family to clarify LSW.  She fell last pm and hit her head - bruise noted over left eye.  She states all was well today -she was up walking around until later.  NIHSS 1 - left facial droop.  CT done.  Dr. Amada Jupiter present.  No other deficits noted.  Troponin elevated - Dr. Juleen China in - aware.  Denies CP.  No acute treatment at this time.  RN Steward Drone to clarify with family LSW whenever available.  Handoff to [employer].

## 2014-09-26 NOTE — ED Provider Notes (Signed)
CSN: [id number]     Arrival date & time 09/26/14  1843 History   First MD Initiated Contact with Patient 09/26/14 1851     Chief Complaint  Patient presents with  . Code Stroke     (Consider location/radiation/quality/duration/timing/severity/associated sxs/prior Treatment) HPI   87yF with L facial droop. Noticed by family around [phone] today. Not clear when last seen normal. Pt unaware of her deficits. She denies any complaints. No pain anywhere. No acute visual complaints, numbness, tingling or focal loss of strength. Code Stroke was initiated.   Past Medical History  Diagnosis Date  . Anxiety   . Hypertension    Past Surgical History  Procedure Laterality Date  . Back surgery    . Abdominal hysterectomy     No family history on file. History  Substance Use Topics  . Smoking status: Never Smoker   . Smokeless tobacco: Not on file  . Alcohol Use: No   OB History    No data available     Review of Systems  All systems reviewed and negative, other than as noted in HPI.   Allergies  Penicillins  Home Medications   Prior to Admission medications   Not on File   BP 121/87 mmHg  Pulse 67  Temp(Src) 97.8 F (36.6 C) (Oral)  Resp 18  Ht 5' 3.5" (1.613 m)  Wt 193 lb 2 oz (87.6 kg)  BMI 33.67 kg/m2  SpO2 96% Physical Exam  Constitutional: She is oriented to person, place, and time. She appears well-developed and well-nourished. No distress.  HENT:  Head: Normocephalic and atraumatic.  Eyes: Conjunctivae are normal. Right eye exhibits no discharge. Left eye exhibits no discharge.  Neck: Neck supple.  Cardiovascular: Normal rate, regular rhythm and normal heart sounds.  Exam reveals no gallop and no friction rub.   No murmur heard. Pulmonary/Chest: Effort normal and breath sounds normal. No respiratory distress.  Abdominal: Soft. She exhibits no distension. There is no tenderness.  Musculoskeletal: She exhibits no edema or tenderness.  Neurological: She is  alert and oriented to person, place, and time. She exhibits normal muscle tone. Coordination normal.  L facial droop. CN2-12 otherwise intact. Strength 5/5 b/l u/l ext. Sensation intact to light touch. Good finger to nose b/l.   Skin: Skin is warm and dry.  Psychiatric: She has a normal mood and affect. Her behavior is normal. Thought content normal.  Nursing note and vitals reviewed.   ED Course  Procedures (including critical care time)  CRITICAL CARE Performed by: Raeford Razor Total critical care time: 35 minutes Critical care time was exclusive of separately billable procedures and treating other patients. Critical care was necessary to treat or prevent imminent or life-threatening deterioration. Critical care was time spent personally by me on the following activities: development of treatment plan with patient and/or surrogate as well as nursing, discussions with consultants, evaluation of patient's response to treatment, examination of patient, obtaining history from patient or surrogate, ordering and performing treatments and interventions, ordering and review of laboratory studies, ordering and review of radiographic studies, pulse oximetry and re-evaluation of patient's condition.  Labs Review Labs Reviewed  DIFFERENTIAL - Abnormal; Notable for the following:    Lymphs Abs 4.2 (*)    All other components within normal limits  I-STAT CHEM 8, ED - Abnormal; Notable for the following:    BUN 22 (*)    Glucose, Bld 142 (*)    Calcium, Ion 1.06 (*)    All other components within  normal limits  I-STAT TROPOININ, ED - Abnormal; Notable for the following:    Troponin i, poc 0.65 (*)    All other components within normal limits  CBG MONITORING, ED - Abnormal; Notable for the following:    Glucose-Capillary 146 (*)    All other components within normal limits  PROTIME-INR  APTT  CBC  ETHANOL  COMPREHENSIVE METABOLIC PANEL  URINE RAPID DRUG SCREEN, HOSP PERFORMED  URINALYSIS,  ROUTINE W REFLEX MICROSCOPIC (NOT AT [facility])    Imaging Review Ct Head Wo Contrast  09/26/2014   CLINICAL DATA:  Left facial droop.  EXAM: CT HEAD WITHOUT CONTRAST  TECHNIQUE: Contiguous axial images were obtained from the base of the skull through the vertex without intravenous contrast.  COMPARISON:  None.  FINDINGS: Chronic microvascular ischemic change is noted. No evidence of acute intracranial abnormality including hemorrhage, infarct, mass lesion, mass effect, midline shift or abnormal extra-axial fluid collection is seen. No hydrocephalus or pneumocephalus. The calvarium is intact. Imaged paranasal sinuses demonstrate a very small mucous retention cyst or polyp in the right maxillary.  IMPRESSION: No acute abnormality.  Chronic microvascular ischemic change.   Electronically Signed   By: Drusilla Kanner M.D.   On: 09/26/2014 18:59     EKG Interpretation None      MDM   Final diagnoses:  Stroke  Dyspnea  Elevated Troponin  87yF presenting as code stroke. L facial droop. Noticed around 1715. Not clear when last normal. Evaluated by neurology on arrival. With mild deficits that she is not even aware of and unclear onset, no TPA. Her EKG has diffuse ischemic changes and troponin elevation of 0.65. She has no complaints though aside from lower back pain which is chronic. CT w/o evidence of bleed. ASA and heparin. Cardiology consultation. She reports she sees "Dr. Kirtland Bouchard," a cardiologist in [place], but cannot tell me what specifically for. Scant prior records. Discussed with cardiology. Medicine admission.    Raeford Razor, MD 10/04/14 [phone]

## 2014-09-27 ENCOUNTER — Inpatient Hospital Stay (HOSPITAL_COMMUNITY): Payer: Medicare Other

## 2014-09-27 DIAGNOSIS — I639 Cerebral infarction, unspecified: Secondary | ICD-10-CM

## 2014-09-27 DIAGNOSIS — R7989 Other specified abnormal findings of blood chemistry: Secondary | ICD-10-CM

## 2014-09-27 DIAGNOSIS — F411 Generalized anxiety disorder: Secondary | ICD-10-CM

## 2014-09-27 DIAGNOSIS — J9600 Acute respiratory failure, unspecified whether with hypoxia or hypercapnia: Secondary | ICD-10-CM

## 2014-09-27 DIAGNOSIS — I10 Essential (primary) hypertension: Secondary | ICD-10-CM | POA: Diagnosis present

## 2014-09-27 DIAGNOSIS — I509 Heart failure, unspecified: Secondary | ICD-10-CM

## 2014-09-27 LAB — CBC WITH DIFFERENTIAL/PLATELET
BASOS ABS: 0 10*3/uL (ref 0.0–0.1)
Basophils Relative: 0 % (ref 0–1)
EOS ABS: 0 10*3/uL (ref 0.0–0.7)
Eosinophils Relative: 1 % (ref 0–5)
HEMATOCRIT: 37.9 % (ref 36.0–46.0)
Hemoglobin: 12 g/dL (ref 12.0–15.0)
LYMPHS ABS: 2.6 10*3/uL (ref 0.7–4.0)
LYMPHS PCT: 33 % (ref 12–46)
MCH: 29.6 pg (ref 26.0–34.0)
MCHC: 31.7 g/dL (ref 30.0–36.0)
MCV: 93.3 fL (ref 78.0–100.0)
Monocytes Absolute: 0.3 10*3/uL (ref 0.1–1.0)
Monocytes Relative: 3 % (ref 3–12)
Neutro Abs: 5.1 10*3/uL (ref 1.7–7.7)
Neutrophils Relative %: 63 % (ref 43–77)
Platelets: 210 10*3/uL (ref 150–400)
RBC: 4.06 MIL/uL (ref 3.87–5.11)
RDW: 14.1 % (ref 11.5–15.5)
WBC: 8 10*3/uL (ref 4.0–10.5)

## 2014-09-27 LAB — COMPREHENSIVE METABOLIC PANEL
ALT: 29 U/L (ref 14–54)
AST: 37 U/L (ref 15–41)
Albumin: 3.3 g/dL — ABNORMAL LOW (ref 3.5–5.0)
Alkaline Phosphatase: 107 U/L (ref 38–126)
Anion gap: 10 (ref 5–15)
BUN: 19 mg/dL (ref 6–20)
CO2: 27 mmol/L (ref 22–32)
Calcium: 8.7 mg/dL — ABNORMAL LOW (ref 8.9–10.3)
Chloride: 102 mmol/L (ref 101–111)
Creatinine, Ser: 0.88 mg/dL (ref 0.44–1.00)
GFR calc Af Amer: >60 mL/min (ref >60)
GFR, EST NON AFRICAN AMERICAN: 58 mL/min — AB (ref >60)
GLUCOSE: 145 mg/dL — AB (ref 65–99)
Potassium: 4.3 mmol/L (ref 3.5–5.1)
SODIUM: 139 mmol/L (ref 135–145)
TOTAL PROTEIN: 6.8 g/dL (ref 6.5–8.1)
Total Bilirubin: 0.9 mg/dL (ref 0.3–1.2)

## 2014-09-27 LAB — BLOOD GAS, ARTERIAL
Acid-Base Excess: 1.5 mmol/L (ref 0.0–2.0)
BICARBONATE: 26.4 meq/L — AB (ref 20.0–24.0)
DELIVERY SYSTEMS: POSITIVE
Drawn by: 358491
EXPIRATORY PAP: 6
FIO2: 0.4 %
Inspiratory PAP: 12
O2 Saturation: 98.4 %
PH ART: 7.36 (ref 7.350–7.450)
PO2 ART: 124 mmHg — AB (ref 80.0–100.0)
Patient temperature: 98.6
RATE: 10 resp/min
TCO2: 27.8 mmol/L (ref 0–100)
pCO2 arterial: 47.9 mmHg — ABNORMAL HIGH (ref 35.0–45.0)

## 2014-09-27 LAB — BRAIN NATRIURETIC PEPTIDE: B NATRIURETIC PEPTIDE 5: 1915.4 pg/mL — AB (ref 0.0–100.0)

## 2014-09-27 LAB — GLUCOSE, CAPILLARY
GLUCOSE-CAPILLARY: 140 mg/dL — AB (ref 65–99)
GLUCOSE-CAPILLARY: 144 mg/dL — AB (ref 65–99)
Glucose-Capillary: 178 mg/dL — ABNORMAL HIGH (ref 65–99)

## 2014-09-27 LAB — LIPID PANEL
Cholesterol: 120 mg/dL (ref 0–200)
HDL: 37 mg/dL — ABNORMAL LOW (ref >40)
LDL CALC: 62 mg/dL (ref 0–99)
TRIGLYCERIDES: 103 mg/dL (ref <150)
Total CHOL/HDL Ratio: 3.2 RATIO
VLDL: 21 mg/dL (ref 0–40)

## 2014-09-27 LAB — TSH: TSH: 1.926 u[IU]/mL (ref 0.350–4.500)

## 2014-09-27 LAB — HEPARIN LEVEL (UNFRACTIONATED)
HEPARIN UNFRACTIONATED: 0.19 [IU]/mL — AB (ref 0.30–0.70)
Heparin Unfractionated: 0.3 IU/mL (ref 0.30–0.70)

## 2014-09-27 LAB — TROPONIN I
Troponin I: 0.48 ng/mL — ABNORMAL HIGH (ref <0.031)
Troponin I: 0.36 ng/mL — ABNORMAL HIGH (ref <0.031)

## 2014-09-27 MED ORDER — LORAZEPAM 2 MG/ML IJ SOLN
0.5000 mg | INTRAMUSCULAR | Status: DC | PRN
Start: 2014-09-27 — End: 2014-09-29
  Administered 2014-09-27 – 2014-09-29 (×6): 0.5 mg via INTRAVENOUS
  Filled 2014-09-27 (×6): qty 1

## 2014-09-27 MED ORDER — METHYLPREDNISOLONE SODIUM SUCC 125 MG IJ SOLR
60.0000 mg | Freq: Two times a day (BID) | INTRAMUSCULAR | Status: DC
  Administered 2014-09-27: 60 mg via INTRAVENOUS
  Filled 2014-09-27 (×2): qty 0.96
  Filled 2014-09-27: qty 2

## 2014-09-27 MED ORDER — MORPHINE SULFATE 2 MG/ML IJ SOLN
1.0000 mg | Freq: Once | INTRAMUSCULAR | Status: AC
  Administered 2014-09-27: 1 mg via INTRAVENOUS
  Filled 2014-09-27: qty 1

## 2014-09-27 MED ORDER — IPRATROPIUM-ALBUTEROL 0.5-2.5 (3) MG/3ML IN SOLN
3.0000 mL | Freq: Four times a day (QID) | RESPIRATORY_TRACT | Status: DC
  Administered 2014-09-27 (×3): 3 mL via RESPIRATORY_TRACT
  Filled 2014-09-27 (×4): qty 3

## 2014-09-27 MED ORDER — INSULIN ASPART 100 UNIT/ML ~~LOC~~ SOLN
0.0000 [IU] | SUBCUTANEOUS | Status: DC
  Administered 2014-09-27 (×2): 1 [IU] via SUBCUTANEOUS
  Administered 2014-09-27: 2 [IU] via SUBCUTANEOUS
  Administered 2014-09-28 (×3): 1 [IU] via SUBCUTANEOUS
  Administered 2014-09-28: 2 [IU] via SUBCUTANEOUS
  Administered 2014-09-28 – 2014-09-30 (×4): 1 [IU] via SUBCUTANEOUS

## 2014-09-27 MED ORDER — PERFLUTREN LIPID MICROSPHERE
1.0000 mL | INTRAVENOUS | Status: AC | PRN
  Administered 2014-09-27 (×2): 2 mL via INTRAVENOUS
  Filled 2014-09-27: qty 10

## 2014-09-27 MED ORDER — CHLORHEXIDINE GLUCONATE 0.12 % MT SOLN
15.0000 mL | Freq: Two times a day (BID) | OROMUCOSAL | Status: DC
  Administered 2014-09-28 – 2014-09-30 (×5): 15 mL via OROMUCOSAL
  Filled 2014-09-27 (×8): qty 15

## 2014-09-27 MED ORDER — METHYLPREDNISOLONE SODIUM SUCC 125 MG IJ SOLR
125.0000 mg | Freq: Once | INTRAMUSCULAR | Status: AC
Start: 2014-09-27 — End: 2014-09-27
  Administered 2014-09-27: 125 mg via INTRAVENOUS
  Filled 2014-09-27: qty 2

## 2014-09-27 MED ORDER — MORPHINE SULFATE 2 MG/ML IJ SOLN
1.0000 mg | INTRAMUSCULAR | Status: DC | PRN
  Administered 2014-09-27 – 2014-09-28 (×5): 1 mg via INTRAVENOUS
  Filled 2014-09-27 (×5): qty 1

## 2014-09-27 MED ORDER — FUROSEMIDE 10 MG/ML IJ SOLN
40.0000 mg | Freq: Two times a day (BID) | INTRAMUSCULAR | Status: DC
  Administered 2014-09-27 – 2014-09-29 (×4): 40 mg via INTRAVENOUS
  Filled 2014-09-27 (×6): qty 4

## 2014-09-27 MED ORDER — FUROSEMIDE 10 MG/ML IJ SOLN
40.0000 mg | Freq: Once | INTRAMUSCULAR | Status: AC
  Administered 2014-09-27: 40 mg via INTRAVENOUS
  Filled 2014-09-27: qty 4

## 2014-09-27 MED ORDER — CETYLPYRIDINIUM CHLORIDE 0.05 % MT LIQD
7.0000 mL | Freq: Two times a day (BID) | OROMUCOSAL | Status: DC
  Administered 2014-09-28 – 2014-09-30 (×5): 7 mL via OROMUCOSAL

## 2014-09-27 NOTE — Progress Notes (Signed)
RT removed patient from BIPAP and placed on 4lnc.  Patient states she feels much better and can take a deeper breath without BIPAP. Patient stated if she felt short of breath she would let nurse know to all RT. Son at bedside. RT will continue to monitor.

## 2014-09-27 NOTE — Progress Notes (Signed)
Preliminary results by tech - Carotid Duplex Completed. Mild plaque visualized in both ICA with no evidence of stenosis noted.  Marilynne Halsted, BS, RDMS, RVT

## 2014-09-27 NOTE — Progress Notes (Signed)
[facility] PROGRESS NOTE  Allison Macdonald SXJ:[id number] DOB: 05/13/1926 DOA: 09/26/2014 PCP: No primary care provider on file.  Assessment/Plan:  Principal Problem:   Stroke: workup underway.  Passed RN swallow screen in ED, but noted to be drooling and night shift RN noted some difficulty swallowing aspirin. With worsening respiratory condition as well, will make her nothing by mouth until seen by speech therapy. Neurology following. Active Problems:    Acute respiratory failure:  Has increased work of breathing. Initial sats last night were in the high 90s, now on the low 90s. Has received a DuoNeb. Will also give Solu-Medrol. Chest x-ray images reviewed. Looks hazier to me, particularly on the right, though radiology mentions no acute pulmonary edema. Will give a dose of Lasix, await echocardiogram, and check a BNP. No known history of heart failure. May be aspirating.  Patient reports that she would not want to be intubated. She would want resuscitation otherwise and full medical care. Discussed with family members change in clinical status. She does not have a living well. Will place DO NOT INTUBATE/limited code orders in the chart. Hopefully, we can get her turned around. Otherwise she will need to go to a higher level of care and potentially BiPAP. Place Foley catheter.   COPD exacerbation: steroids, nebs   Elevated troponin: trend flat. EKG is abnormal with fairly diffuse T-wave inversions, but patient has no chest pain. Cardiology has been consulted. Unclear whether EKG is ischemia related versus neurologic etiology.   Atrial flutter:  No known previous history of same. On heparin drip. Echo pending.   Generalized anxiety disorder:  On twice a day Ativan when necessary at home. Will order IV when necessary to avoid withdrawal.   Essential hypertension:  meds held  Addendum: After about an hour, 400 mL urine output, nebs, Solu-Medrol, still struggling to breathe. Will place BiPAP,  check ABG, transfer to stepdown unit.  Code Status:  limited: DO NOT INTUBATE  Family Communication:  Son by phone Disposition Plan:    Consults Neurology Cardiology  Procedures:     Antibiotics:    HPI/Subjective:  denies chest pain. Does feel short of breath. No cough.  Objective: Filed Vitals:   09/27/14 0742  BP: 193/93  Pulse: 75  Temp:   Resp: 23   No intake or output data in the 24 hours ending 09/27/14 0842 Filed Weights   09/26/14 1857  Weight: 87.6 kg (193 lb 2 oz)    Exam:   General:   pale, somewhat diaphoretic, and fairly severe respiratory distress. Able to speak and truncated sentences. Alert, oriented and appropriate.   Cardiovascular:  regular rate rhythm without murmurs gallops rubs   Respiratory:  diffuse expiratory wheeze. Accessory muscle use.   Abdomen:  soft nontender nondistended  Ext:  no edema  Neurologic: Cranial nerves: Left facial droop present. Drooling on the right. Alert oriented and appropriate. Motor strength 5 out of 5 in all extremities.  Basic Metabolic Panel:  Recent Labs Lab 09/26/14 1847 09/26/14 1854 09/27/14 0343  NA 138 140 139  K 4.1 4.0 4.3  CL 103 102 102  CO2 23  --  27  GLUCOSE 137* 142* 145*  BUN 20 22* 19  CREATININE 1.10* 1.00 0.88  CALCIUM 9.1  --  8.7*   Liver Function Tests:  Recent Labs Lab 09/26/14 1847 09/27/14 0343  AST 39 37  ALT 31 29  ALKPHOS 113 107  BILITOT 1.1 0.9  PROT 7.2 6.8  ALBUMIN 3.9 3.3*  No results for input(s): LIPASE, AMYLASE in the last 168 hours. No results for input(s): AMMONIA in the last 168 hours. CBC:  Recent Labs Lab 09/26/14 1847 09/26/14 1854 09/27/14 0343  WBC 10.5  --  8.0  NEUTROABS 5.5  --  5.1  HGB 12.9 14.6 12.0  HCT 39.2 43.0 37.9  MCV 93.1  --  93.3  PLT 229  --  210   Cardiac Enzymes:  Recent Labs Lab 09/26/14 2237 09/27/14 0343  TROPONINI 0.47* 0.48*   BNP (last 3 results) No results for input(s): BNP in the last 8760  hours.  ProBNP (last 3 results) No results for input(s): PROBNP in the last 8760 hours.  CBG:  Recent Labs Lab 09/26/14 1910  GLUCAP 146*    No results found for this or any previous visit (from the past 240 hour(s)).   Studies: Dg Chest 2 View  09/26/2014   CLINICAL DATA:  A stroke.  Shortness of breath.  EXAM: CHEST  2 VIEW  COMPARISON:  12/15/2013  FINDINGS: Mild cardiac enlargement. Pulmonary vascularity appears normal. Interstitial changes in the lungs similar prior study suggesting chronic fibrosis. No focal airspace disease or consolidation. No blunting of costophrenic angles. No pneumothorax. Degenerative changes in the spine.  IMPRESSION: Cardiac enlargement. Chronic interstitial pattern. No evidence of active pulmonary disease.   Electronically Signed   By: Burman Nieves M.D.   On: 09/26/2014 22:57   Ct Head Wo Contrast  09/26/2014   CLINICAL DATA:  Left facial droop.  EXAM: CT HEAD WITHOUT CONTRAST  TECHNIQUE: Contiguous axial images were obtained from the base of the skull through the vertex without intravenous contrast.  COMPARISON:  None.  FINDINGS: Chronic microvascular ischemic change is noted. No evidence of acute intracranial abnormality including hemorrhage, infarct, mass lesion, mass effect, midline shift or abnormal extra-axial fluid collection is seen. No hydrocephalus or pneumocephalus. The calvarium is intact. Imaged paranasal sinuses demonstrate a very small mucous retention cyst or polyp in the right maxillary.  IMPRESSION: No acute abnormality.  Chronic microvascular ischemic change.   Electronically Signed   By: Drusilla Kanner M.D.   On: 09/26/2014 18:59   Dg Chest Port 1 View  09/27/2014   CLINICAL DATA:  Dyspnea  EXAM: PORTABLE CHEST - 1 VIEW  COMPARISON:  09/26/2014  FINDINGS: Cardiomegaly again noted. Stable mild interstitial prominence bilateral without convincing pulmonary edema. Slight worsening bilateral basilar hazy atelectasis or infiltrate right  greater than left. Question small right pleural effusion.  IMPRESSION: Stable mild interstitial prominence bilateral without convincing pulmonary edema. Slight worsening bilateral basilar hazy atelectasis or infiltrate right greater than left. Question small right pleural effusion.   Electronically Signed   By: Natasha Mead M.D.   On: 09/27/2014 08:23    Scheduled Meds: . aspirin  300 mg Rectal Daily   Or  . aspirin  325 mg Oral Daily  . ipratropium-albuterol  3 mL Nebulization QID   Continuous Infusions: . sodium chloride 50 mL/hr at 09/26/14 2205  . heparin 1,150 Units/hr (09/27/14 0507)    Critical care time 60 minutes   Aquinnah Devin L  [facility] Pager [phone]. If 7PM-7AM, please contact night-coverage at [email or web address], password [facility] 09/27/2014, 8:42 AM  LOS: 1 day

## 2014-09-27 NOTE — Progress Notes (Signed)
STROKE TEAM PROGRESS NOTE   HISTORY Allison Macdonald is a 81 y.o. female with a history of htn who was seen to have left facial droop at 5:10 pm 09/26/2014 ([facility]). It was unclear exactly when she was last known to be normal as the patient was not aware of her deficits. She was brought in as a code stroke and CT was negative. She has mild left sided weakness, but was not a candidate for tpa both due to mild deficits, but alos unclear time of onset. Patient was not administered TPA secondary to delay in arrival. She was admitted for further evaluation and treatment.   SUBJECTIVE (INTERVAL HISTORY) Her son is at the bedside.  Overall she feels her condition is controlled after recent transfer to step down due to respiratory distress. They report she fell yesterday, tripped over a box. Later, she developed a "drawn face, acting funny, could not raise her L hand". Family called EMS who brought her here. She was weak for a few hours per them. No hx stroke per family.   This morning patient had respiratory distress and was moved to the stepdown unit and placed on BiPAP. Suspected that she aspirated. Currently, BiPap is on and patient appears comfortable. Sons are at the bedside.   OBJECTIVE Temp:  [97.2 F (36.2 C)-99.1 F (37.3 C)] 99.1 F (37.3 C) (06/14 1100) Pulse Rate:  [57-80] 65 (06/14 1200) Cardiac Rhythm:  [-] Atrial flutter (06/14 1221) Resp:  [15-26] 19 (06/14 1200) BP: (121-193)/(70-124) 167/78 mmHg (06/14 1200) SpO2:  [95 %-100 %] 96 % (06/14 1200) FiO2 (%):  [40 %] 40 % (06/14 1100) Weight:  [85.9 kg (189 lb 6 oz)-87.6 kg (193 lb 2 oz)] 85.9 kg (189 lb 6 oz) (06/14 1033)   Recent Labs Lab 09/26/14 1910 09/27/14 1130  GLUCAP 146* 140*    Recent Labs Lab 09/26/14 1847 09/26/14 1854 09/27/14 0343  NA 138 140 139  K 4.1 4.0 4.3  CL 103 102 102  CO2 23  --  27  GLUCOSE 137* 142* 145*  BUN 20 22* 19  CREATININE 1.10* 1.00 0.88  CALCIUM 9.1  --  8.7*    Recent Labs Lab  09/26/14 1847 09/27/14 0343  AST 39 37  ALT 31 29  ALKPHOS 113 107  BILITOT 1.1 0.9  PROT 7.2 6.8  ALBUMIN 3.9 3.3*    Recent Labs Lab 09/26/14 1847 09/26/14 1854 09/27/14 0343  WBC 10.5  --  8.0  NEUTROABS 5.5  --  5.1  HGB 12.9 14.6 12.0  HCT 39.2 43.0 37.9  MCV 93.1  --  93.3  PLT 229  --  210    Recent Labs Lab 09/26/14 2237 09/27/14 0343 09/27/14 0940  TROPONINI 0.47* 0.48* 0.36*    Recent Labs  09/26/14 1847  LABPROT 15.2  INR 1.19    Recent Labs  09/26/14 2021  COLORURINE ORANGE*  LABSPEC 1.033*  PHURINE 5.0  GLUCOSEU NEGATIVE  HGBUR TRACE*  BILIRUBINUR SMALL*  KETONESUR 15*  PROTEINUR 100*  UROBILINOGEN 1.0  NITRITE NEGATIVE  LEUKOCYTESUR NEGATIVE       Component Value Date/Time   CHOL 120 09/27/2014 0343   TRIG 103 09/27/2014 0343   HDL 37* 09/27/2014 0343   CHOLHDL 3.2 09/27/2014 0343   VLDL 21 09/27/2014 0343   LDLCALC 62 09/27/2014 0343   No results found for: HGBA1C    Component Value Date/Time   LABOPIA POSITIVE* 09/26/2014 2021   COCAINSCRNUR NONE DETECTED 09/26/2014 2021  LABBENZ POSITIVE* 09/26/2014 2021   AMPHETMU NONE DETECTED 09/26/2014 2021   THCU NONE DETECTED 09/26/2014 2021   LABBARB NONE DETECTED 09/26/2014 2021     Recent Labs Lab 09/26/14 2237  ETH <5    Dg Chest 2 View  09/26/2014   CLINICAL DATA:  A stroke.  Shortness of breath.  EXAM: CHEST  2 VIEW  COMPARISON:  12/15/2013  FINDINGS: Mild cardiac enlargement. Pulmonary vascularity appears normal. Interstitial changes in the lungs similar prior study suggesting chronic fibrosis. No focal airspace disease or consolidation. No blunting of costophrenic angles. No pneumothorax. Degenerative changes in the spine.  IMPRESSION: Cardiac enlargement. Chronic interstitial pattern. No evidence of active pulmonary disease.   Electronically Signed   By: Burman Nieves M.D.   On: 09/26/2014 22:57   Ct Head Wo Contrast  09/26/2014   CLINICAL DATA:  Left facial droop.   EXAM: CT HEAD WITHOUT CONTRAST  TECHNIQUE: Contiguous axial images were obtained from the base of the skull through the vertex without intravenous contrast.  COMPARISON:  None.  FINDINGS: Chronic microvascular ischemic change is noted. No evidence of acute intracranial abnormality including hemorrhage, infarct, mass lesion, mass effect, midline shift or abnormal extra-axial fluid collection is seen. No hydrocephalus or pneumocephalus. The calvarium is intact. Imaged paranasal sinuses demonstrate a very small mucous retention cyst or polyp in the right maxillary.  IMPRESSION: No acute abnormality.  Chronic microvascular ischemic change.   Electronically Signed   By: Drusilla Kanner M.D.   On: 09/26/2014 18:59   Dg Chest Port 1 View  09/27/2014   CLINICAL DATA:  Dyspnea  EXAM: PORTABLE CHEST - 1 VIEW  COMPARISON:  09/26/2014  FINDINGS: Cardiomegaly again noted. Stable mild interstitial prominence bilateral without convincing pulmonary edema. Slight worsening bilateral basilar hazy atelectasis or infiltrate right greater than left. Question small right pleural effusion.  IMPRESSION: Stable mild interstitial prominence bilateral without convincing pulmonary edema. Slight worsening bilateral basilar hazy atelectasis or infiltrate right greater than left. Question small right pleural effusion.   Electronically Signed   By: Natasha Mead M.D.   On: 09/27/2014 08:23   2D Echocardiogram   - Left ventricle: The cavity size was normal. Systolic function wasseverely reduced. The estimated ejection fraction was in therange of 20% to 25%. There is akinesis of the mid-apicalanteroseptal, anterior, anterolateral, lateral, inferior, andapical myocardium. Has Takotsubo configuration . Dopplerparameters are consistent with a reversible restrictive pattern,indicative of decreased left ventricular diastolic complianceand/or increased left atrial pressure (grade 3 diastolicdysfunction). - Mitral valve: There was mild  regurgitation. - Right ventricle: The cavity size was moderately dilated. Wallthickness was normal. Systolic function was moderately reduced. - Pulmonary arteries: Systolic pressure was mildly to moderatelyincreased. PA peak pressure: 48 mm Hg (S). Impressions: Takotsubo appearance. Correlates with low level troponin and Twave inversion on ECG.  Carotid Doppler  Carotid Duplex Completed. Mild plaque visualized in both ICA with no evidence of stenosis noted.    PHYSICAL EXAM Frail elderly Caucasian lady in respiratory distress on BiPAP. Marland Kitchen Afebrile. Head is nontraumatic. Neck is supple without bruit.    Cardiac exam no murmur or gallop. Lungs are clear to auscultation. Distal pulses are well felt. Neurological Exam : Awake alert oriented 3. Speech is full and hypophonic due to BiPAP but can be understood with some difficulty. No aphasia. Extraocular moments are full range without nystagmus. Fundi could not be visualized. Vision acuity  seems adequate. suspect left homonymous hemianopsia with decreased blink to threat on the left and diminished finger counting.  Face symmetric. Tongue midline. Motor system exam mild left upper and lower extremity drift. Weakness of left grip and intrinsic hand muscles. Weakness of left hip flexor and ankle dorsiflexors 4/5. Left plantar is equivocal right downgoing. Touch and pinprick sensation are preserved bilaterally. Coordination is slow but accurate on the left and normal on the right. Gait was not tested. ASSESSMENT/PLAN Allison Macdonald is a 81 y.o. female with history of hypertension, COPD and chronic back pain  presenting with left sided weakness and confusion. She did not receive IV t-PA due to being out of the window.   Possible right PCA stroke, workup underway  Resultant  Neuro symptoms resolved  MRI  pending  MRA  pending  Carotid Doppler  No significant stenosis  2D Echo  EF 20-25% with Takotsubo appearance. No embolus seen  LDL 62 at  goal < 70  HgbA1c pending  IV heparin for VTE prophylaxis Diet NPO time specified  no antithrombotic prior to admission, now on heparin and aspirin 300 mg suppository daily. No indication for both agents from the stroke standpoint.  Ongoing aggressive stroke risk factor management  Therapy recommendations:  pending   Disposition:  pending   Atrial flutter  Not on anticoagulation prior to admission  Currently on IV heparin  Elevated troponin may be related  Acute chest pain  Currently on IV heparin  Troponin mildly elevated  Essential Hypertension  Home meds:   lisinopril  BP 121-193/65-93 past 24h (09/27/2014 @ 5:13 PM)   Other Stroke Risk Factors  Advanced age  Obesity, Body mass index is 33.02 kg/(m^2).   Family hx stroke ()  Other Active Problems  COPD exacerbation  Respiratory distress, felt to be volume overloaded. Placed on Lasix. BiPap.  Generalized anxiety disorder, on Ativan twice a day prior to admission  Hospital day # 1  Rhoderick Moody [facility] See Amion for Pager information 09/27/2014 5:13 PM   I have personally examined this patient, reviewed notes, independently viewed imaging studies, participated in medical decision making and plan of care. I have made any additions or clarifications directly to the above note. Agree with note above. She presented with a fall and subsequently developed transient left-sided weakness and speech difficulties likely from a right PCA infarct   She remains at risk for neurological worsening, recurrent stroke, TIA and needs ongoing stroke evaluation and aggressive risk factor modification. Patient will be unable to get an MRI due to her respiratory distress and hence we'll repeat CT scan in 24 hours Delia Heady, MD Medical Director [facility] Pager: [phone] 09/27/2014 5:22 PM  To contact Stroke Continuity provider, please refer to [email or web address]. After hours, contact General  Neurology

## 2014-09-27 NOTE — Progress Notes (Signed)
ANTICOAGULATION CONSULT NOTE - Follow Up Consult  Pharmacy Consult for Heparin  Indication: chest pain/ACS  Allergies  Allergen Reactions  . Penicillins    Patient Measurements: Height: 5' 3.5" (161.3 cm) Weight: 189 lb 6 oz (85.9 kg) IBW/kg (Calculated) : 53.55  Heparin dosing weight = 73 kg   Vital Signs: Temp: 99.1 F (37.3 C) (06/14 1100) Temp Source: Axillary (06/14 1100) BP: 149/65 mmHg (06/14 1500) Pulse Rate: 69 (06/14 1500)  Labs:  Recent Labs  09/26/14 1847 09/26/14 1854 09/26/14 2237 09/27/14 0343 09/27/14 0940 09/27/14 1515  HGB 12.9 14.6  --  12.0  --   --   HCT 39.2 43.0  --  37.9  --   --   PLT 229  --   --  210  --   --   APTT 30  --   --   --   --   --   LABPROT 15.2  --   --   --   --   --   INR 1.19  --   --   --   --   --   HEPARINUNFRC  --   --   --  0.19*  --  0.30  CREATININE 1.10* 1.00  --  0.88  --   --   TROPONINI  --   --  0.47* 0.48* 0.36*  --     Estimated Creatinine Clearance: 49.1 mL/min (by C-G formula based on Cr of 0.88).  Assessment: 12 YOF admitted as code stroke 06/13. Pt is also having atrial flutter, T wave inversions and elevated troponin. Pharmacy is managing IV heparin. Heparin level 0.3 low-end therapeutic on 1150 units/hr. Troponin mildly elevated x 3.  Goal of Therapy:  Heparin level 0.3-0.5 units/ml Monitor platelets by anticoagulation protocol: Yes   Plan:  -Increase heparin slightly to 1200 units/hr to remain in therapeutic range. -Daily CBC/HL -Monitor for bleeding  Bayard Hugger, PharmD, BCPS  Clinical Pharmacist  Pager: [phone]   09/27/2014,4:24 PM

## 2014-09-27 NOTE — Progress Notes (Signed)
Pt transported from 4N27 to 2C06 without event. No complications. Vital signs stable throughout. Patient tolerated well. RN at bedside. RT will continue to monitor.

## 2014-09-27 NOTE — Progress Notes (Signed)
Patient Name: Allison Macdonald Date of Encounter: 09/27/2014  Principal Problem:   Stroke Active Problems:   Hypertension   Elevated troponin   COPD exacerbation   Atrial flutter   Acute respiratory failure   Generalized anxiety disorder   Essential hypertension   Primary Cardiologist: Dr Cassell Smiles  Patient Profile: 81 yo female w/ hx HTN, dementia admitted as code stroke 06/13. Cards seeing for atrial flutter, T wave inversions and elevated troponin.   SUBJECTIVE: Had chest pain earlier, says she has at home when she needs her inhaler. Per RN facial droop is worse. Increased SOB/WOB and decreased O2 sats>>BiPAP, so facial droop difficult to assess. Chest pain has resolved.   OBJECTIVE Filed Vitals:   09/27/14 0530 09/27/14 0742 09/27/14 0846 09/27/14 0912  BP: 158/91 193/93 191/124   Pulse: 60 75 69 80  Temp: 97.7 F (36.5 C)     TempSrc: Oral     Resp:  23 24 26   Height:      Weight:      SpO2: 98% 96% 95% 96%   No intake or output data in the 24 hours ending 09/27/14 1026 Filed Weights   09/26/14 1857  Weight: 193 lb 2 oz (87.6 kg)    PHYSICAL EXAM General: Well developed, well nourished, female in moderate respiratory distress. Head: Normocephalic, atraumatic.  Neck: Supple without bruits, JVD elevated, difficult to quantify 2nd BiPAP. Lungs:  Resp regular and unlabored, bilateral rales > 1/2 . Heart: Irreg irreg, S1, S2, no S3, S4, or murmur; no rub. Abdomen: Soft, non-tender, non-distended, BS + x 4.  Extremities: No clubbing, cyanosis, no edema.  Neuro: Alert and oriented X 2. Moves all extremities spontaneously. Left visual field defect (new per RN) Psych: Normal affect.  LABS: CBC:  Recent Labs  09/26/14 1847 09/26/14 1854 09/27/14 0343  WBC 10.5  --  8.0  NEUTROABS 5.5  --  5.1  HGB 12.9 14.6 12.0  HCT 39.2 43.0 37.9  MCV 93.1  --  93.3  PLT 229  --  210   INR:  Recent Labs  09/26/14 1847  INR 1.19   Basic Metabolic  Panel:  Recent Labs  09/26/14 1847 09/26/14 1854 09/27/14 0343  NA 138 140 139  K 4.1 4.0 4.3  CL 103 102 102  CO2 23  --  27  GLUCOSE 137* 142* 145*  BUN 20 22* 19  CREATININE 1.10* 1.00 0.88  CALCIUM 9.1  --  8.7*   Liver Function Tests:  Recent Labs  09/26/14 1847 09/27/14 0343  AST 39 37  ALT 31 29  ALKPHOS 113 107  BILITOT 1.1 0.9  PROT 7.2 6.8  ALBUMIN 3.9 3.3*   Cardiac Enzymes:  Recent Labs  09/26/14 2237 09/27/14 0343  TROPONINI 0.47* 0.48*    Recent Labs  09/26/14 1852  TROPIPOC 0.65*   Fasting Lipid Panel:  Recent Labs  09/27/14 0343  CHOL 120  HDL 37*  LDLCALC 62  TRIG 888  CHOLHDL 3.2    TELE:  Atrial flutter, RVR at times, PVCs and pairs.    Radiology/Studies: Dg Chest 2 View 09/26/2014   CLINICAL DATA:  A stroke.  Shortness of breath.  EXAM: CHEST  2 VIEW  COMPARISON:  12/15/2013  FINDINGS: Mild cardiac enlargement. Pulmonary vascularity appears normal. Interstitial changes in the lungs similar prior study suggesting chronic fibrosis. No focal airspace disease or consolidation. No blunting of costophrenic angles. No pneumothorax. Degenerative changes in the spine.  IMPRESSION:  Cardiac enlargement. Chronic interstitial pattern. No evidence of active pulmonary disease.   Electronically Signed   By: Burman Nieves M.D.   On: 09/26/2014 22:57   Ct Head Wo Contrast 09/26/2014   CLINICAL DATA:  Left facial droop.  EXAM: CT HEAD WITHOUT CONTRAST  TECHNIQUE: Contiguous axial images were obtained from the base of the skull through the vertex without intravenous contrast.  COMPARISON:  None.  FINDINGS: Chronic microvascular ischemic change is noted. No evidence of acute intracranial abnormality including hemorrhage, infarct, mass lesion, mass effect, midline shift or abnormal extra-axial fluid collection is seen. No hydrocephalus or pneumocephalus. The calvarium is intact. Imaged paranasal sinuses demonstrate a very small mucous retention cyst or  polyp in the right maxillary.  IMPRESSION: No acute abnormality.  Chronic microvascular ischemic change.   Electronically Signed   By: Drusilla Kanner M.D.   On: 09/26/2014 18:59   Dg Chest Port 1 View 09/27/2014   CLINICAL DATA:  Dyspnea  EXAM: PORTABLE CHEST - 1 VIEW  COMPARISON:  09/26/2014  FINDINGS: Cardiomegaly again noted. Stable mild interstitial prominence bilateral without convincing pulmonary edema. Slight worsening bilateral basilar hazy atelectasis or infiltrate right greater than left. Question small right pleural effusion.  IMPRESSION: Stable mild interstitial prominence bilateral without convincing pulmonary edema. Slight worsening bilateral basilar hazy atelectasis or infiltrate right greater than left. Question small right pleural effusion.   Electronically Signed   By: Natasha Mead M.D.   On: 09/27/2014 08:23     Current Medications:  . aspirin  300 mg Rectal Daily   Or  . aspirin  325 mg Oral Daily  . insulin aspart  0-9 Units Subcutaneous 6 times per day  . ipratropium-albuterol  3 mL Nebulization QID  . methylPREDNISolone (SOLU-MEDROL) injection  60 mg Intravenous Q12H   . sodium chloride 10 mL/hr at 09/27/14 0826  . heparin 1,150 Units/hr (09/27/14 0507)    ASSESSMENT AND PLAN: Principal Problem:   Stroke - per IM/Neuro - tx to 2C due to resp issues and worsening neuro condition    Elevated troponin - no clear anginal sx - ez have been flat, repeat trop pending - echo ordered, it will be done today - initial ECG w/ T wave inversions V2-6, improved slightly today    Acute respiratory Failure - Improved w/ Lasix, Solu-Medrol, morphine, Ativan, BiPAP - good UOP w/ Lasix 40 mg IV - BNP ordered  - volume overload by exam, echo pending - make Lasix 40 mg IV BID for now.   Otherwise, per IM Active Problems:   Hypertension   COPD exacerbation   Atrial flutter   Acute respiratory failure   Generalized anxiety disorder   Essential  hypertension   Signed, Leanna Battles 10:26 AM 09/27/2014   Agree with note by Theodore Demark PA-C   Runell Gess, M.D., FACP, [facility], [facility] [address] [place], [place]  [address]  [phone] 09/28/2014 6:56 AM  .

## 2014-09-27 NOTE — Progress Notes (Signed)
ANTICOAGULATION CONSULT NOTE - Follow Up Consult  Pharmacy Consult for Heparin  Indication: chest pain/ACS  Allergies  Allergen Reactions  . Penicillins    Patient Measurements: Height: 5' 3.5" (161.3 cm) Weight: 193 lb 2 oz (87.6 kg) IBW/kg (Calculated) : 53.55   Vital Signs: Temp: 97.5 F (36.4 C) (06/14 0330) Temp Source: Oral (06/14 0330) BP: 175/110 mmHg (06/14 0330) Pulse Rate: 72 (06/14 0330)  Labs:  Recent Labs  09/26/14 1847 09/26/14 1854 09/26/14 2237 09/27/14 0343  HGB 12.9 14.6  --  12.0  HCT 39.2 43.0  --  37.9  PLT 229  --   --  210  APTT 30  --   --   --   LABPROT 15.2  --   --   --   INR 1.19  --   --   --   HEPARINUNFRC  --   --   --  0.19*  CREATININE 1.10* 1.00  --   --   TROPONINI  --   --  0.47*  --     Estimated Creatinine Clearance: 43.6 mL/min (by C-G formula based on Cr of 1).  Assessment: Heparin for elevated troponin, possible stroke, first HL is sub-therapeutic, other labs as above.   Goal of Therapy:  Heparin level 0.3-0.5 units/ml Monitor platelets by anticoagulation protocol: Yes   Plan:  -Increase heparin to 1150 units/hr -1300 HL -Daily CBC/HL -Monitor for bleeding  Abran Duke 09/27/2014,4:54 AM

## 2014-09-27 NOTE — Progress Notes (Signed)
Pt shows no signs of increase wob or distress at this time. Bipap not needed. Pt remains on 2Lpm nasal cannula SpO2-98%. RT will continue to monitor as needed.

## 2014-09-27 NOTE — Progress Notes (Signed)
  Echocardiogram 2D Echocardiogram has been performed.  Allison Macdonald 09/27/2014, 2:51 PM

## 2014-09-27 NOTE — Significant Event (Signed)
Rapid Response Event Note  Overview: Called to assist with patient with resp difficulty Time Called: 0743 Arrival Time: 0747 Event Type: Respiratory  Initial Focused Assessment:  On arrival patient supine in bed alert skin hot and moist to touch - speaking full sentences - some moderate use of accessory muscles - bil BS present with exp wheezes worse on right side - slight decrease in RLL but moving air all over.  Some crackles noted - worse on right.  On NRB mask - O2 sats 92-94%.  Left facial droop - drooling from that side - I saw her last PM on admission with code stroke - the left facial droop is more pronounced this AM. Left field cut noted this AM - recognizes left side and turns to see - no neglect but with left field cut.   No other neuro focal deficits noted.   Fair cough - non-productive.  Aflutter on 12 lead - same as admission - rate 78.   BP 193/93  RR 28.  No pedal edema noted.  No JVD.   Patient states she takes a "fluid pill" at home when my legs swell.  She states some chest pressure - but also states it feels like her home events when she needs to take her Advair.     Interventions: Patient receiving duoneb ordered by Dr. Lendell Caprice.  Stat call for PCXR.  RT at bedside with me.  Patient repositioned for optimal breathing.  Post neb patient states she does not feel much better - O2 sats on 4 liter nasal cannula 96% - still with moderate accessory muscle use. Bladder scan - 150 cc noted.  Dr. Lendell Caprice at bedside.  Stat doses of Lasix 40 mg and Solumedrol 125 mg given per order.  PCXR done.  183/94  HR 74 RR 30 O2 sats 95% on nasal cannula.  # 22 gauge IV catheter inserted left forearm - to NSL.  Foley catheter placed.  400 cc clear urine out.  Now with increased WOB - more diffuculty with full sentences - states just can't get my air.  Dr. Lendell Caprice aware - will Bipap.  Placed on Bipap per RT - 12/6 and 40%.  Morphine 1 mg IV given per order Dr. Lendell Caprice - patient more relaxed.  175/110 HR 78  RR 32 O2 sats 93% - after Bipap 178/95 HR 73 RR 24 O2 sats 100% - resting with decreased WOB.  4854 - Restless -coughing - Bipap removed - oral suction - states she feels little nausea - Zofran 4 mg IV given - no neuro deficits noted - no headache.  States she fells like this at home with anxiety for which she takes Lorazapam three times a day at home - Lorazapam 0.5 mg IV given - states she feels better - "I need to relax"  - increased WOB again without Bipap - restarted at 12/6 40% - more decreased in RLL - better air movement otherwise on both sides with decrease in wheezing.  186/95 HR 58 with aflutter - HR up to 67 with waking - she states she feels better - easier to breathe.  147/105  HR 65  RR 24 with Bipap O2 sats 97%.  Right side remains with decreased BS RLL - coarse crackles remain.  Left side fine coarse but with good air movement now.   150/87  HR 54- 63  RR 24 with Bipap - O2 sats 97%.  Transferred to 2C06 without incident. Handoff to [employer] - questions answered.  Son updated by Aida Puffer.     Event Summary: Name of Physician Notified: Dr. Lendell Caprice at  (pta rrt)    at    Outcome: Transferred (Comment) (2c16)     Moshe Cipro, Leotis Pain

## 2014-09-27 NOTE — Progress Notes (Signed)
PT Cancellation Note  Patient Details Name: Allison Macdonald MRN: [id number] DOB: Aug 19, 1926   Cancelled Treatment:    Reason Eval/Treat Not Completed: Medical issues which prohibited therapy (per nsg, cancel evaluation ), resp. Issues, pt being transferred to higher level of care.   Fabio Asa 09/27/2014, 9:00 AM Charlotte Crumb, PT DPT  [phone]

## 2014-09-28 ENCOUNTER — Inpatient Hospital Stay (HOSPITAL_COMMUNITY): Payer: Medicare Other

## 2014-09-28 DIAGNOSIS — I5023 Acute on chronic systolic (congestive) heart failure: Secondary | ICD-10-CM | POA: Clinically undetermined

## 2014-09-28 DIAGNOSIS — I484 Atypical atrial flutter: Secondary | ICD-10-CM

## 2014-09-28 LAB — CBC
HEMATOCRIT: 38.1 % (ref 36.0–46.0)
HEMOGLOBIN: 12.3 g/dL (ref 12.0–15.0)
MCH: 29.9 pg (ref 26.0–34.0)
MCHC: 32.3 g/dL (ref 30.0–36.0)
MCV: 92.7 fL (ref 78.0–100.0)
Platelets: 216 10*3/uL (ref 150–400)
RBC: 4.11 MIL/uL (ref 3.87–5.11)
RDW: 13.9 % (ref 11.5–15.5)
WBC: 6.3 10*3/uL (ref 4.0–10.5)

## 2014-09-28 LAB — HEMOGLOBIN A1C
Hgb A1c MFr Bld: 5.9 % — ABNORMAL HIGH (ref 4.8–5.6)
Mean Plasma Glucose: 123 mg/dL

## 2014-09-28 LAB — GLUCOSE, CAPILLARY
GLUCOSE-CAPILLARY: 116 mg/dL — AB (ref 65–99)
GLUCOSE-CAPILLARY: 143 mg/dL — AB (ref 65–99)
GLUCOSE-CAPILLARY: 151 mg/dL — AB (ref 65–99)
GLUCOSE-CAPILLARY: 155 mg/dL — AB (ref 65–99)
Glucose-Capillary: 150 mg/dL — ABNORMAL HIGH (ref 65–99)
Glucose-Capillary: 131 mg/dL — ABNORMAL HIGH (ref 65–99)

## 2014-09-28 LAB — HEPARIN LEVEL (UNFRACTIONATED): Heparin Unfractionated: 0.45 IU/mL (ref 0.30–0.70)

## 2014-09-28 LAB — MRSA PCR SCREENING: MRSA by PCR: NEGATIVE

## 2014-09-28 MED ORDER — IOHEXOL 350 MG/ML SOLN: 50.0000 mL | Freq: Once | INTRAVENOUS | Status: AC | PRN

## 2014-09-28 MED ORDER — IPRATROPIUM-ALBUTEROL 0.5-2.5 (3) MG/3ML IN SOLN
3.0000 mL | Freq: Three times a day (TID) | RESPIRATORY_TRACT | Status: DC
  Administered 2014-09-28 – 2014-09-29 (×5): 3 mL via RESPIRATORY_TRACT
  Filled 2014-09-28 (×7): qty 3

## 2014-09-28 NOTE — Plan of Care (Signed)
Problem: Progression Outcomes Goal: Tolerating diet/TF at goal rate Outcome: Progressing Diet started today. MBS done. Pt tolerating well.  Goal: Pain controlled Outcome: Progressing PRN Morphine given.

## 2014-09-28 NOTE — Progress Notes (Signed)
STROKE TEAM PROGRESS NOTE   HISTORY Allison Macdonald is a 81 y.o. female with a history of htn who was seen to have left facial droop at 5:10 pm 09/26/2014 (LKW). It was unclear exactly when she was last known to be normal as the patient was not aware of her deficits. She was brought in as a code stroke and CT was negative. She has mild left sided weakness, but was not a candidate for tpa both due to mild deficits, but alos unclear time of onset. Patient was not administered TPA secondary to delay in arrival. She was admitted for further evaluation and treatment.   SUBJECTIVE (INTERVAL HISTORY) Her son is not at the bedside.  Overall she feels her condition is improved today and she is off Bipap.    OBJECTIVE Temp:  [97.4 F (36.3 C)-98.9 F (37.2 C)] 98.9 F (37.2 C) (06/15 1227) Pulse Rate:  [50-71] 63 (06/15 1227) Cardiac Rhythm:  [-] Atrial flutter (06/15 0745) Resp:  [13-29] 21 (06/15 1227) BP: (105-150)/(48-78) 149/70 mmHg (06/15 1227) SpO2:  [96 %-100 %] 96 % (06/15 1227) Weight:  [184 lb 1.4 oz (83.5 kg)] 184 lb 1.4 oz (83.5 kg) (06/15 0330)   Recent Labs Lab 09/27/14 2000 09/28/14 0050 09/28/14 0401 09/28/14 0743 09/28/14 1225  GLUCAP 144* 151* 150* 143* 155*    Recent Labs Lab 09/26/14 1847 09/26/14 1854 09/27/14 0343  NA 138 140 139  K 4.1 4.0 4.3  CL 103 102 102  CO2 23  --  27  GLUCOSE 137* 142* 145*  BUN 20 22* 19  CREATININE 1.10* 1.00 0.88  CALCIUM 9.1  --  8.7*    Recent Labs Lab 09/26/14 1847 09/27/14 0343  AST 39 37  ALT 31 29  ALKPHOS 113 107  BILITOT 1.1 0.9  PROT 7.2 6.8  ALBUMIN 3.9 3.3*    Recent Labs Lab 09/26/14 1847 09/26/14 1854 09/27/14 0343 09/28/14 0255  WBC 10.5  --  8.0 6.3  NEUTROABS 5.5  --  5.1  --   HGB 12.9 14.6 12.0 12.3  HCT 39.2 43.0 37.9 38.1  MCV 93.1  --  93.3 92.7  PLT 229  --  210 216    Recent Labs Lab 09/26/14 2237 09/27/14 0343 09/27/14 0940  TROPONINI 0.47* 0.48* 0.36*    Recent Labs   09/26/14 1847  LABPROT 15.2  INR 1.19    Recent Labs  09/26/14 2021  COLORURINE ORANGE*  LABSPEC 1.033*  PHURINE 5.0  GLUCOSEU NEGATIVE  HGBUR TRACE*  BILIRUBINUR SMALL*  KETONESUR 15*  PROTEINUR 100*  UROBILINOGEN 1.0  NITRITE NEGATIVE  LEUKOCYTESUR NEGATIVE       Component Value Date/Time   CHOL 120 09/27/2014 0343   TRIG 103 09/27/2014 0343   HDL 37* 09/27/2014 0343   CHOLHDL 3.2 09/27/2014 0343   VLDL 21 09/27/2014 0343   LDLCALC 62 09/27/2014 0343   Lab Results  Component Value Date   HGBA1C 5.9* 09/27/2014      Component Value Date/Time   LABOPIA POSITIVE* 09/26/2014 2021   COCAINSCRNUR NONE DETECTED 09/26/2014 2021   LABBENZ POSITIVE* 09/26/2014 2021   AMPHETMU NONE DETECTED 09/26/2014 2021   THCU NONE DETECTED 09/26/2014 2021   LABBARB NONE DETECTED 09/26/2014 2021     Recent Labs Lab 09/26/14 2237  ETH <5    Dg Chest 2 View  09/26/2014   CLINICAL DATA:  A stroke.  Shortness of breath.  EXAM: CHEST  2 VIEW  COMPARISON:  12/15/2013  FINDINGS:  Mild cardiac enlargement. Pulmonary vascularity appears normal. Interstitial changes in the lungs similar prior study suggesting chronic fibrosis. No focal airspace disease or consolidation. No blunting of costophrenic angles. No pneumothorax. Degenerative changes in the spine.  IMPRESSION: Cardiac enlargement. Chronic interstitial pattern. No evidence of active pulmonary disease.   Electronically Signed   By: Burman Nieves M.D.   On: 09/26/2014 22:57   Ct Head Wo Contrast  09/26/2014   CLINICAL DATA:  Left facial droop.  EXAM: CT HEAD WITHOUT CONTRAST  TECHNIQUE: Contiguous axial images were obtained from the base of the skull through the vertex without intravenous contrast.  COMPARISON:  None.  FINDINGS: Chronic microvascular ischemic change is noted. No evidence of acute intracranial abnormality including hemorrhage, infarct, mass lesion, mass effect, midline shift or abnormal extra-axial fluid collection is  seen. No hydrocephalus or pneumocephalus. The calvarium is intact. Imaged paranasal sinuses demonstrate a very small mucous retention cyst or polyp in the right maxillary.  IMPRESSION: No acute abnormality.  Chronic microvascular ischemic change.   Electronically Signed   By: Drusilla Kanner M.D.   On: 09/26/2014 18:59   Dg Chest Port 1 View  09/27/2014   CLINICAL DATA:  Dyspnea  EXAM: PORTABLE CHEST - 1 VIEW  COMPARISON:  09/26/2014  FINDINGS: Cardiomegaly again noted. Stable mild interstitial prominence bilateral without convincing pulmonary edema. Slight worsening bilateral basilar hazy atelectasis or infiltrate right greater than left. Question small right pleural effusion.  IMPRESSION: Stable mild interstitial prominence bilateral without convincing pulmonary edema. Slight worsening bilateral basilar hazy atelectasis or infiltrate right greater than left. Question small right pleural effusion.   Electronically Signed   By: Natasha Mead M.D.   On: 09/27/2014 08:23   Dg Swallowing Func-speech Pathology  09/28/2014    Objective Swallowing Evaluation:   Modified Barium Swallow Patient Details  Name: Allison Macdonald MRN: [id number] Date of Birth: 05-Nov-1926  Today's Date: 09/28/2014 Time: SLP Start Time (ACUTE ONLY): 1125-SLP Stop Time (ACUTE ONLY): 1145 SLP Time Calculation (min) (ACUTE ONLY): 20 min  Past Medical History:  Past Medical History  Diagnosis Date  . Anxiety   . Hypertension    Past Surgical History:  Past Surgical History  Procedure Laterality Date  . Back surgery    . Abdominal hysterectomy     HPI:  Other Pertinent Information: 81 y.o. female with history of hypertension,   COPD, HTN, anxiety  and chronic back pain was brought to the ER after  patient was found to be having increasing confusion and left facial droop.  CT no acute abnormality. Chronic microvascular ischemic change. Per chart  patient also had a fall 2 days ago which patient states she tripped and  fell and has a small left  peri-orbital hematoma. Pt unable to tolerate  MRI. Respiratory distress 6/14 with suspected aspiration, put on Bipap.  CXR stable mild interstitial prominence bilateral without convincing  pulmonary edema. Slight worsening bilateral basilar hazy atelectasis or  infiltrate right greater than left. Question small right pleural effusion.  Indications aspiration during bedside.  No Data Recorded  Assessment / Plan / Recommendation CHL IP CLINICAL IMPRESSIONS 09/28/2014  Therapy Diagnosis Mild pharyngeal phase dysphagia;Mild oral phase  dysphagia  Clinical Impression Pt. demonstrated mild oropharyngeal dysphagia with  decreased oral cohesion and ability to fully clear oral cavity resulting  in post swallow spill to valleculae and pyriform sinuses. Intermittent  penetration with thin (flash x 1 and deep) resulting in strong cough  reflex. Penetration not observed with therapeutic strategy  using chin  tuck. Minimal sporadic vallecular residue due to mildly decreased tongue  based retraction. SLP recommends Dys 2, thin liquids, chin tuck with  liquids, pills whole in applesauce, full supervision for strategy and  continue ST intervention and education.      CHL IP TREATMENT RECOMMENDATION 09/28/2014  Treatment Recommendations Therapy as outlined in treatment plan below     CHL IP DIET RECOMMENDATION 09/28/2014  SLP Diet Recommendations Dysphagia 2 (Fine chop);Thin  Liquid Administration via (None)  Medication Administration Whole meds with puree  Compensations Slow rate;Small sips/bites;Chin tuck  Postural Changes and/or Swallow Maneuvers (None)     CHL IP OTHER RECOMMENDATIONS 09/28/2014  Recommended Consults (None)  Oral Care Recommendations Oral care BID  Other Recommendations (None)    CHL IP FOLLOW UP RECOMMENDATIONS 09/28/2014  Follow up Recommendations Inpatient Rehab     CHL IP FREQUENCY AND DURATION 09/28/2014  Speech Therapy Frequency (ACUTE ONLY) min 2x/week  Treatment Duration 2 weeks     Pertinent Vitals/Pain none     SLP Swallow Goals No flowsheet data found.  No flowsheet data found.    CHL IP REASON FOR REFERRAL 09/28/2014  Reason for Referral Objectively evaluate swallowing function     CHL IP ORAL PHASE 09/28/2014  Lips (None)  Tongue (None)  Mucous membranes (None)  Nutritional status (None)  Other (None)  Oxygen therapy (None)  Oral Phase Impaired  Oral - Pudding Teaspoon (None)  Oral - Pudding Cup (None)  Oral - Honey Teaspoon (None)  Oral - Honey Cup (None)  Oral - Honey Syringe (None)  Oral - Nectar Teaspoon (None)  Oral - Nectar Cup (None)  Oral - Nectar Straw (None)  Oral - Nectar Syringe (None)  Oral - Ice Chips (None)  Oral - Thin Teaspoon (None)  Oral - Thin Cup (None)  Oral - Thin Straw (None)  Oral - Thin Syringe (None)  Oral - Puree (None)  Oral - Mechanical Soft (None)  Oral - Regular (None)  Oral - Multi-consistency (None)  Oral - Pill (None)  Oral Phase - Comment (None)      CHL IP PHARYNGEAL PHASE 09/28/2014  Pharyngeal Phase Impaired  Pharyngeal - Pudding Teaspoon (None)  Penetration/Aspiration details (pudding teaspoon) (None)  Pharyngeal - Pudding Cup (None)  Penetration/Aspiration details (pudding cup) (None)  Pharyngeal - Honey Teaspoon (None)  Penetration/Aspiration details (honey teaspoon) (None)  Pharyngeal - Honey Cup (None)  Penetration/Aspiration details (honey cup) (None)  Pharyngeal - Honey Syringe (None)  Penetration/Aspiration details (honey syringe) (None)  Pharyngeal - Nectar Teaspoon (None)  Penetration/Aspiration details (nectar teaspoon) (None)  Pharyngeal - Nectar Cup (None)  Penetration/Aspiration details (nectar cup) (None)  Pharyngeal - Nectar Straw (None)  Penetration/Aspiration details (nectar straw) (None)  Pharyngeal - Nectar Syringe (None)  Penetration/Aspiration details (nectar syringe) (None)  Pharyngeal - Ice Chips (None)  Penetration/Aspiration details (ice chips) (None)  Pharyngeal - Thin Teaspoon (None)  Penetration/Aspiration details (thin teaspoon) (None)  Pharyngeal - Thin  Cup (None)  Penetration/Aspiration details (thin cup) (None)  Pharyngeal - Thin Straw (None)  Penetration/Aspiration details (thin straw) (None)  Pharyngeal - Thin Syringe (None)  Penetration/Aspiration details (thin syringe') (None)  Pharyngeal - Puree (None)  Penetration/Aspiration details (puree) (None)  Pharyngeal - Mechanical Soft (None)  Penetration/Aspiration details (mechanical soft) (None)  Pharyngeal - Regular (None)  Penetration/Aspiration details (regular) (None)  Pharyngeal - Multi-consistency (None)  Penetration/Aspiration details (multi-consistency) (None)  Pharyngeal - Pill (None)  Penetration/Aspiration details (pill) (None)  Pharyngeal Comment (None)  CHL IP CERVICAL ESOPHAGEAL PHASE 09/28/2014  Cervical Esophageal Phase WFL  Pudding Teaspoon (None)  Pudding Cup (None)  Honey Teaspoon (None)  Honey Cup (None)  Honey Straw (None)  Nectar Teaspoon (None)  Nectar Cup (None)  Nectar Straw (None)  Nectar Sippy Cup (None)  Thin Teaspoon (None)  Thin Cup (None)  Thin Straw (None)  Thin Sippy Cup (None)  Cervical Esophageal Comment (None)    No flowsheet data found.         Royce Macadamia 09/28/2014, 12:19 PM  Breck Coons Lonell Face.Ed CCC-SLP Pager [phone]     2D Echocardiogram   - Left ventricle: The cavity size was normal. Systolic function wasseverely reduced. The estimated ejection fraction was in therange of 20% to 25%. There is akinesis of the mid-apicalanteroseptal, anterior, anterolateral, lateral, inferior, andapical myocardium. Has Takotsubo configuration . Dopplerparameters are consistent with a reversible restrictive pattern,indicative of decreased left ventricular diastolic complianceand/or increased left atrial pressure (grade 3 diastolicdysfunction). - Mitral valve: There was mild regurgitation. - Right ventricle: The cavity size was moderately dilated. Wallthickness was normal. Systolic function was moderately reduced. - Pulmonary arteries: Systolic pressure was  mildly to moderatelyincreased. PA peak pressure: 48 mm Hg (S). Impressions: Takotsubo appearance. Correlates with low level troponin and Twave inversion on ECG.  Carotid Doppler  Carotid Duplex Completed. Mild plaque visualized in both ICA with no evidence of stenosis noted.    PHYSICAL EXAM Frail elderly Caucasian lady in respiratory distress on BiPAP. Marland Kitchen Afebrile. Head is nontraumatic. Neck is supple without bruit.    Cardiac exam no murmur or gallop. Lungs are clear to auscultation. Distal pulses are well felt. Neurological Exam : Awake alert oriented 3. Speech is clear. No aphasia. Extraocular moments are full range without nystagmus. Fundi could not be visualized. Vision acuity  seems diminished in left eye ( chronic per patient). Face symmetric. Tongue midline. Motor system exam mild left upper and lower extremity drift. Weakness of left grip and intrinsic hand muscles. Weakness of left hip flexor and ankle dorsiflexors 4/5. Left plantar is equivocal right downgoing. Touch and pinprick sensation are preserved bilaterally. Coordination is slow but accurate on the left and normal on the right. Gait was not tested. ASSESSMENT/PLAN Allison Macdonald is a 81 y.o. female with history of hypertension, COPD and chronic back pain  presenting with left sided weakness and confusion. She did not receive IV t-PA due to being out of the window.   Possible right brain TIA, workup underway  Resultant  Neuro symptoms resolved  MRI  Patient refused  MRA  refused  Carotid Doppler  No significant stenosis  2D Echo  EF 20-25% with Takotsubo appearance. No embolus seen  LDL 62 at goal < 70  HgbA1c pending  IV heparin for VTE prophylaxis DIET DYS 2 Room service appropriate?: Yes; Fluid consistency:: Thin  no antithrombotic prior to admission, now on heparin and aspirin 300 mg suppository daily. No indication for both agents from the stroke standpoint.  Ongoing aggressive stroke risk factor  management  Therapy recommendations:  pending   Disposition:  pending   Atrial flutter  Not on anticoagulation prior to admission  Currently on IV heparin  Elevated troponin may be related  Acute chest pain  Currently on IV heparin  Troponin mildly elevated  Essential Hypertension  Home meds:   lisinopril  BP 121-193/65-93 past 24h (09/28/2014 @ 3:07 PM)   Other Stroke Risk Factors  Advanced age  Obesity, Body mass index is 32.09 kg/(m^2).  Family hx stroke ()  Other Active Problems  COPD exacerbation  Respiratory distress, felt to be volume overloaded. Placed on Lasix. BiPap.  Generalized anxiety disorder, on Ativan twice a day prior to admission  Hospital day # 2  Keith Cancio  [facility] See Amion for Pager information 09/28/2014 3:07 PM  Recommend check CT angiogram of the brain instead of MRI as patient is not comfortable getting it done. Mobilize out of bed. Therapy consults. Continue stroke workup.  Delia Heady, MD Medical Director [facility] Pager: [phone] 09/28/2014 3:07 PM  To contact Stroke Continuity provider, please refer to [email or web address]. After hours, contact General Neurology

## 2014-09-28 NOTE — Progress Notes (Signed)
Rehab Admissions Coordinator Note:  Patient was screened by [name] for appropriateness for an Inpatient Acute Rehab Consult.  At this time, we are recommending Inpatient Rehab consult.  Allison Macdonald L 09/28/2014, 4:21 PM  I can be reached at [phone].

## 2014-09-28 NOTE — Evaluation (Signed)
Physical Therapy Evaluation Patient Details Name: Allison Macdonald MRN: [id number] DOB: 10-30-1926 Today's Date: 09/28/2014   History of Present Illness  81 y.o. female with history of hypertension, COPD, HTN, anxiety and chronic back pain was brought to the ER after patient was found to be having increasing confusion and left facial droop. CT no acute abnormality. Chronic microvascular ischemic change. Per chart patient also had a fall 2 days ago which patient states she tripped and fell and has a small left peri-orbital hematoma. Pt unable to tolerate MRI. Respiratory distress 6/14 with suspected aspiration, put on Bipap. CXR stable mild interstitial prominence bilateral without convincing pulmonary edema. Slight worsening bilateral basilar hazy atelectasis or infiltrate right greater than left. Question small right pleural effusion.   Clinical Impression  Pt presenting with impaired balance, L sided neglect/inattention, and impaired cognition including decreased insight to deficits and decreased safety awareness. Per son pt completely independent PTA and was driving. Son does live with patient and can provide supervision level of assist. Pt to strongly benefit from CIR upon d/c. Pt demo's excellent rehab potential and suspect she can achieve supervision level of function to safely return home with son. Pt also presenting with possible R BPPV and would benefit from vestibular rehab in both acute and CIR setting.    Follow Up Recommendations CIR    Equipment Recommendations  None recommended by PT    Recommendations for Other Services Rehab consult     Precautions / Restrictions Precautions Precautions: Fall Precaution Comments: L sided inattention Restrictions Weight Bearing Restrictions: No      Mobility  Bed Mobility Overal bed mobility: Needs Assistance Bed Mobility: Supine to Sit     Supine to sit: Min guard     General bed mobility comments: Used bed rails to get to  EOB  Transfers Overall transfer level: Needs assistance Equipment used: Rolling walker (2 wheeled) Transfers: Sit to/from Stand Sit to Stand: Min guard         General transfer comment: Pushed back of legs against bed for support. Took increased time to rise to stand from sit.  Ambulation/Gait Ambulation/Gait assistance: Min assist Ambulation Distance (Feet): 100 Feet Assistive device: Rolling walker (2 wheeled) Gait Pattern/deviations: Step-through pattern;Decreased stride length;Staggering left;Drifts right/left;Narrow base of support Gait velocity: Decreased Gait velocity interpretation: Below normal speed for age/gender General Gait Details: Pt tracked toward the left consistently throughout gait session. Required verbal and tactile cues to look where she was going, stay inside the walker, and keep a straight path. assist to navigate around obstacles.  Stairs            Wheelchair Mobility    Modified Rankin (Stroke Patients Only)       Balance Overall balance assessment: Needs assistance Sitting-balance support: Feet supported Sitting balance-Leahy Scale: Good     Standing balance support: Bilateral upper extremity supported Standing balance-Leahy Scale: Fair                               Pertinent Vitals/Pain Pain Assessment: No/denies pain    Home Living Family/patient expects to be discharged to:: Private residence Living Arrangements: Children (son) Available Help at Discharge: Family;Available 24 hours/day Type of Home: House Home Access: Ramped entrance     Home Layout: One level Home Equipment: Grab bars - toilet;Grab bars - tub/shower      Prior Function Level of Independence: Independent         Comments:  ambulated with no device, driving, performs all ADL and IADL     Hand Dominance   Dominant Hand: Right    Extremity/Trunk Assessment   Upper Extremity Assessment: Defer to OT evaluation RUE Deficits / Details:  Pain and weakness with R shoulder flexion         Lower Extremity Assessment: Overall WFL for tasks assessed      Cervical / Trunk Assessment: Normal  Communication      Cognition Arousal/Alertness: Awake/alert Behavior During Therapy: Impulsive Overall Cognitive Status: Impaired/Different from baseline Area of Impairment: Problem solving;Safety/judgement         Safety/Judgement: Decreased awareness of safety;Decreased awareness of deficits (L sided inattention)   Problem Solving: Requires verbal cues;Requires tactile cues;Difficulty sequencing General Comments: pt unable to find items on L side and kept running into things on the L    General Comments General comments (skin integrity, edema, etc.): pt wit noted L eye bruising. Pt also with c/o of "room spinning" when lying on R side or flat on her back. Pt unable to track to the L with the L eye and had R gaze preference.    Exercises        Assessment/Plan    PT Assessment Patient needs continued PT services  PT Diagnosis Difficulty walking;Abnormality of gait;Other (comment) (imparied visual/vestibular system)   PT Problem List Decreased balance;Decreased mobility;Decreased knowledge of use of DME;Decreased safety awareness  PT Treatment Interventions Gait training;DME instruction;Functional mobility training;Therapeutic activities;Therapeutic exercise;Balance training   PT Goals (Current goals can be found in the Care Plan section) Acute Rehab PT Goals Patient Stated Goal: Get back home PT Goal Formulation: With patient Time For Goal Achievement: 10/12/14 Potential to Achieve Goals: Good Additional Goals Additional Goal #1: Pt will demo improved acknowledgement of L side by not running into things on the L during amb.    Frequency Min 4X/week   Barriers to discharge Decreased caregiver support son present but can only provide supervision level of assist    Co-evaluation               End of Session  Equipment Utilized During Treatment: Gait belt;Oxygen Activity Tolerance: Patient tolerated treatment well Patient left: in chair;with chair alarm set;with call bell/phone within reach Nurse Communication: Mobility status         Time: 1431-1511 PT Time Calculation (min) (ACUTE ONLY): 40 min   Charges:   PT Evaluation $Initial PT Evaluation Tier I: 1 Procedure PT Treatments $Gait Training: 8-22 mins $Therapeutic Activity: 8-22 mins   PT G Codes:        Marcene Brawn 09/28/2014, 4:07 PM  Lewis Shock, PT, DPT Pager #: [phone] Office #: [phone]

## 2014-09-28 NOTE — Evaluation (Signed)
Occupational Therapy Evaluation Patient Details Name: Allison Macdonald MRN: [id number] DOB: 1926/07/03 Today's Date: 09/28/2014    History of Present Illness 81 y.o. female with history of hypertension, COPD, HTN, anxiety and chronic back pain was brought to the ER after patient was found to be having increasing confusion and left facial droop. CT no acute abnormality. Chronic microvascular ischemic change. Per chart patient also had a fall 2 days ago which patient states she tripped and fell and has a small left peri-orbital hematoma. Pt unable to tolerate MRI. Respiratory distress 6/14 with suspected aspiration, put on Bipap. CXR stable mild interstitial prominence bilateral without convincing pulmonary edema. Slight worsening bilateral basilar hazy atelectasis or infiltrate right greater than left. Question small right pleural effusion.    Clinical Impression   Prior to admission, pt was independent in ADL, IADL, driving and ambulating without a device.  She presents impaired cognition, L side inattention, dizziness with vision testing and impaired balance interfering with ability to perform at her baseline. Son can provide assist for IADL and supervise mobility at home. Pt with increased anxiety, but calms with reassurance and distraction. Pt has excellent potential to return home with her son with intensive rehab. Will follow acutely.    Follow Up Recommendations  CIR;Supervision/Assistance - 24 hour    Equipment Recommendations   (TBD)    Recommendations for Other Services       Precautions / Restrictions Precautions Precautions: Fall Precaution Comments: L sided inattention Restrictions Weight Bearing Restrictions: No      Mobility Bed Mobility Overal bed mobility: Needs Assistance Bed Mobility: Supine to Sit     Supine to sit: Min guard     General bed mobility comments: Used bed rails to get to EOB  Transfers Overall transfer level: Needs assistance Equipment  used: Rolling walker (2 wheeled) Transfers: Sit to/from Stand Sit to Stand: Min guard         General transfer comment: increase time to stand, steadying assist    Balance Overall balance assessment: Needs assistance Sitting-balance support: Feet supported Sitting balance-Leahy Scale: Good     Standing balance support: Bilateral upper extremity supported Standing balance-Leahy Scale: Fair                              ADL Overall ADL's : Needs assistance/impaired Eating/Feeding: Supervision/ safety;Sitting Eating/Feeding Details (indicate cue type and reason): pt cues herself for swallowing precautions but does not always generalize Grooming: Wash/dry hands;Wash/dry face;Sitting;Minimal assistance   Upper Body Bathing: Minimal assitance;Sitting   Lower Body Bathing: Minimal assistance;Sit to/from stand   Upper Body Dressing : Minimal assistance;Sitting   Lower Body Dressing: Minimal assistance;Sit to/from stand Lower Body Dressing Details (indicate cue type and reason): able to cross foot over opposite knee to access socks/feet                     Vision Additional Comments: R gaze preference, dizziness with attempt to assess saccades, unable to comply with requirements for assessment of visual fields, L side inattention/neglect   Perception     Praxis      Pertinent Vitals/Pain Pain Assessment: No/denies pain     Hand Dominance Right   Extremity/Trunk Assessment Upper Extremity Assessment Upper Extremity Assessment: RUE deficits/detail;LUE deficits/detail RUE Deficits / Details: arthritic changes in hand, 4/5 strength LUE Deficits / Details: arthritic changes in hand, 4/5 strength   Lower Extremity Assessment Lower Extremity Assessment: Defer to PT  evaluation   Cervical / Trunk Assessment Cervical / Trunk Assessment: Normal   Communication Communication Communication: No difficulties   Cognition Arousal/Alertness:  Awake/alert Behavior During Therapy: Impulsive;Anxious (asking for anxiety medication) Overall Cognitive Status: Impaired/Different from baseline Area of Impairment: Problem solving;Safety/judgement         Safety/Judgement: Decreased awareness of safety;Decreased awareness of deficits   Problem Solving: Requires verbal cues;Requires tactile cues;Difficulty sequencing General Comments: pt unable to find items on L side and kept running into things on the L   General Comments       Exercises       Shoulder Instructions      Home Living Family/patient expects to be discharged to:: Private residence Living Arrangements: Children (son) Available Help at Discharge: Family;Available 24 hours/day Type of Home: House Home Access: Ramped entrance     Home Layout: One level     Bathroom Shower/Tub: Chief Strategy Officer: Standard     Home Equipment: Grab bars - toilet;Grab bars - tub/shower      Lives With: Son    Prior Functioning/Environment Level of Independence: Independent        Comments: ambulated with no device, driving, performs all ADL and IADL    OT Diagnosis: Generalized weakness;Cognitive deficits;Disturbance of vision   OT Problem List: Decreased strength;Decreased activity tolerance;Impaired balance (sitting and/or standing);Impaired vision/perception;Decreased cognition;Decreased safety awareness;Decreased knowledge of use of DME or AE   OT Treatment/Interventions: Self-care/ADL training;DME and/or AE instruction;Therapeutic activities;Visual/perceptual remediation/compensation;Cognitive remediation/compensation;Patient/family education;Balance training    OT Goals(Current goals can be found in the care plan section) Acute Rehab OT Goals Patient Stated Goal: Get back home OT Goal Formulation: With patient Time For Goal Achievement: 10/12/14 Potential to Achieve Goals: Good ADL Goals Pt Will Perform Grooming: with supervision;standing Pt  Will Perform Upper Body Bathing: with supervision;sitting Pt Will Perform Lower Body Bathing: with supervision;sit to/from stand Pt Will Perform Upper Body Dressing: with supervision;sitting Pt Will Perform Lower Body Dressing: with supervision;sit to/from stand Pt Will Transfer to Toilet: with supervision;ambulating;regular height toilet Pt Will Perform Toileting - Clothing Manipulation and hygiene: with supervision;sit to/from stand Additional ADL Goal #1: Pt will locate ADL items on L side with minimal verbal cues.  OT Frequency: Min 3X/week   Barriers to D/C:            Co-evaluation              End of Session Nurse Communication: Mobility status (ok to give ginger ale)  Activity Tolerance: Patient tolerated treatment well Patient left: in chair;with call bell/phone within reach;with chair alarm set   Time: 1540-1605 OT Time Calculation (min): 25 min Charges:  OT General Charges $OT Visit: 1 Procedure OT Evaluation $Initial OT Evaluation Tier I: 1 Procedure OT Treatments $Self Care/Home Management : 8-22 mins G-Codes:    Evern Bio 09/28/2014, 4:23 PM  [phone]

## 2014-09-28 NOTE — Progress Notes (Addendum)
Pt coughing after tucking chin while drinking water. NP made aware. Holding thin liquids on pt currently.

## 2014-09-28 NOTE — Evaluation (Signed)
Clinical/Bedside Swallow Evaluation Patient Details  Name: Allison Macdonald MRN: [id number] Date of Birth: Dec 15, 1926  Today's Date: 09/28/2014 Time: SLP Start Time (ACUTE ONLY): 1157 SLP Stop Time (ACUTE ONLY): 1010 SLP Time Calculation (min) (ACUTE ONLY): 12 min  Past Medical History:  Past Medical History  Diagnosis Date  . Anxiety   . Hypertension    Past Surgical History:  Past Surgical History  Procedure Laterality Date  . Back surgery    . Abdominal hysterectomy     HPI:  81 y.o. female with history of hypertension,  COPD, HTN, anxiety  and chronic back pain was brought to the ER after patient was found to be having increasing confusion and left facial droop. CT no acute abnormality. Chronic microvascular ischemic change. Per chart patient also had a fall 2 days ago which patient states she tripped and fell and has a small left peri-orbital hematoma. Pt unable to tolerate MRI. Respiratory distress 6/14 with suspected aspiration, put on Bipap. CXR stable mild interstitial prominence bilateral without convincing pulmonary edema. Slight worsening bilateral basilar hazy atelectasis or infiltrate right greater than left. Question small right pleural effusion.   Assessment / Plan / Recommendation Clinical Impression  Explosive cough, red Macdonald following cup sip water indicative of likely aspiration. Given clinical observations, choking with pill 6/14, likely TIA and pt's confusion (mild-mod), recomemnd objective assessment with MBS (scheduled today at 11:30). RN can give meds in applesauce and periodic ice chips after oral care if desired prior to MBS.    Aspiration Risk  Severe    Diet Recommendation NPO except meds;Ice chips PRN after oral care   Medication Administration: Whole meds with puree    Other  Recommendations Oral Care Recommendations: Oral care BID   Follow Up Recommendations       Frequency and Duration        Pertinent Vitals/Pain headache         Swallow  Study           Oral/Motor/Sensory Function Overall Oral Motor/Sensory Function: Impaired Labial ROM: Reduced left Labial Symmetry: Abnormal symmetry left Labial Strength: Reduced Lingual ROM: Reduced left Lingual Symmetry: Within Functional Limits Lingual Strength: Within Functional Limits Facial ROM: Within Functional Limits Facial Symmetry: Within Functional Limits Facial Strength: Within Functional Limits Velum: Within Functional Limits Mandible: Within Functional Limits   Ice Chips Ice chips: Within functional limits   Thin Liquid Thin Liquid: Impaired Presentation: Cup Oral Phase Impairments:  (none) Pharyngeal  Phase Impairments: Multiple swallows;Cough - Immediate;Decreased hyoid-laryngeal movement    Nectar Thick Nectar Thick Liquid: Not tested   Honey Thick Honey Thick Liquid: Not tested   Puree Puree: Within functional limits   Solid   GO    Solid: Impaired Oral Phase Impairments: Reduced lingual movement/coordination Pharyngeal Phase Impairments: Decreased hyoid-laryngeal movement       Allison Macdonald 09/28/2014,10:36 AM  Allison Macdonald.Ed [employer] [phone]

## 2014-09-28 NOTE — Progress Notes (Addendum)
Speech Pathology    MBSS complete. Full report located under chart review in imaging section and click on DG swallow function. Recommend Dys 2, thin liquids, tuck chin with all liquids, small sips.    Breck Coons [place].Ed [employer] [phone]

## 2014-09-28 NOTE — Progress Notes (Signed)
ANTICOAGULATION CONSULT NOTE - Follow Up Consult  Pharmacy Consult for Heparin Indication: Aflutter  Allergies  Allergen Reactions  . Penicillins     Patient Measurements: Height: 5' 3.5" (161.3 cm) Weight: 184 lb 1.4 oz (83.5 kg) IBW/kg (Calculated) : 53.55 Heparin Dosing Weight: 72kg  Vital Signs: Temp: 98.2 F (36.8 C) (06/15 0743) Temp Source: Oral (06/15 0743) BP: 105/48 mmHg (06/15 0743) Pulse Rate: 62 (06/15 0743)  Labs:  Recent Labs  09/26/14 1847 09/26/14 1854 09/26/14 2237 09/27/14 0343 09/27/14 0940 09/27/14 1515 09/28/14 0255  HGB 12.9 14.6  --  12.0  --   --  12.3  HCT 39.2 43.0  --  37.9  --   --  38.1  PLT 229  --   --  210  --   --  216  APTT 30  --   --   --   --   --   --   LABPROT 15.2  --   --   --   --   --   --   INR 1.19  --   --   --   --   --   --   HEPARINUNFRC  --   --   --  0.19*  --  0.30 0.45  CREATININE 1.10* 1.00  --  0.88  --   --   --   TROPONINI  --   --  0.47* 0.48* 0.36*  --   --     Estimated Creatinine Clearance: 48.4 mL/min (by C-G formula based on Cr of 0.88).   Medications:  Heparin @ 1200 units/hr  Assessment: 87yof admitted as a code stroke on 6/13 - CT head negative. Also found to have elevated troponin, EKG changes, and new aflutter. She was started on IV heparin. Heparin level is therapeutic (aiming for a lower goal in setting of stroke). CBC is stable. No bleeding reported.  Goal of Therapy:  Heparin level Heparin level 0.3-0.5 units/ml Monitor platelets by anticoagulation protocol: Yes   Plan:  1) Continue heparin at 1200 units/hr 2) Heparin level and CBC in AM  Fredrik Rigger 09/28/2014,8:42 AM

## 2014-09-28 NOTE — Evaluation (Signed)
Speech Language Pathology Evaluation Patient Details Name: Allison Macdonald MRN: [id number] DOB: [birth year] Today's Date: 09/28/2014 Time: 1191-4782 SLP Time Calculation (min) (ACUTE ONLY): 12 min  Problem List:  Patient Active Problem List   Diagnosis Date Noted  . Acute respiratory failure 09/27/2014  . Generalized anxiety disorder 09/27/2014  . Essential hypertension 09/27/2014  . Stroke 09/26/2014  . Hypertension 09/26/2014  . Elevated troponin 09/26/2014  . COPD exacerbation 09/26/2014  . Atrial flutter 09/26/2014   Past Medical History:  Past Medical History  Diagnosis Date  . Anxiety   . Hypertension    Past Surgical History:  Past Surgical History  Procedure Laterality Date  . Back surgery    . Abdominal hysterectomy     HPI:  81 y.o. female with history of hypertension,  COPD, HTN, anxiety  and chronic back pain was brought to the ER after patient was found to be having increasing confusion and left facial droop. CT no acute abnormality. Chronic microvascular ischemic change. Per chart patient also had a fall 2 days ago which patient states she tripped and fell and has a small left peri-orbital hematoma. Pt unable to tolerate MRI. Respiratory distress 6/14 with suspected aspiration, put on Bipap. CXR stable mild interstitial prominence bilateral without convincing pulmonary edema. Slight worsening bilateral basilar hazy atelectasis or infiltrate right greater than left. Question small right pleural effusion.   Assessment / Plan / Recommendation Clinical Impression  Pt states she resides with her son, no family present to confirm or cognitive baseline. Mild-moderate cognitive deficts (more moderate when engaged in functional activity) in the areas of working and prospective memory and problem solving in functional activities. She would benefit from continued ST on acute care and on inpatient rehab (unsure of physical abilities).    SLP Assessment  Patient needs continued  Speech Lanaguage Pathology Services    Follow Up Recommendations  Inpatient Rehab    Frequency and Duration min 2x/week  2 weeks   Pertinent Vitals/Pain Pain Assessment:  (headache, notified RN)   SLP Goals  Potential to Achieve Goals (ACUTE ONLY):  (fair-good)  SLP Evaluation Prior Functioning  Cognitive/Linguistic Baseline: Information not available  Lives With: Son   Cognition  Overall Cognitive Status: No family/caregiver present to determine baseline cognitive functioning (deficits apparent ) Arousal/Alertness: Awake/alert Orientation Level: Oriented to person;Oriented to place;Oriented to time Attention: Sustained Sustained Attention: Appears intact Memory: Impaired Memory Impairment: Retrieval deficit;Storage deficit;Decreased short term memory Decreased Short Term Memory: Verbal basic Awareness: Impaired Awareness Impairment: Anticipatory impairment;Emergent impairment Problem Solving:  (overall fucntional for verbal, suspect difficulty during act)    Comprehension  Auditory Comprehension Overall Auditory Comprehension: Appears within functional limits for tasks assessed Yes/No Questions: Within Functional Limits Commands: Within Functional Limits Conversation: Simple Interfering Components: Attention;Working Theatre manager: Not tested Reading Comprehension Reading Status:  (TBA)    Expression Expression Primary Mode of Expression: Verbal Verbal Expression Overall Verbal Expression: Appears within functional limits for tasks assessed Initiation: No impairment Level of Generative/Spontaneous Verbalization: Conversation Repetition: No impairment Naming: No impairment Pragmatics: No impairment Written Expression Dominant Hand: Right Written Expression: Within Functional Limits (name only assessed)   Oral / Motor Oral Motor/Sensory Function Overall Oral Motor/Sensory Function: Impaired Labial ROM: Reduced  left Labial Symmetry: Abnormal symmetry left Labial Strength: Reduced Lingual ROM: Reduced left Lingual Symmetry: Within Functional Limits Lingual Strength: Within Functional Limits Facial ROM: Within Functional Limits Facial Symmetry: Within Functional Limits Facial Strength: Within Functional Limits Velum: Within Functional Limits Mandible: Within Functional  Limits Motor Speech Overall Motor Speech: Appears within functional limits for tasks assessed Respiration: Within functional limits Phonation: Normal Resonance: Within functional limits Articulation: Within functional limitis Intelligibility: Intelligible Motor Planning: Witnin functional limits   GO     Royce Macadamia 09/28/2014, 10:49 AM   Breck Coons Allison Macdonald.Ed [employer] [phone]

## 2014-09-28 NOTE — Progress Notes (Signed)
Pt shows no signs of increase wob or distress at this time and is resting comfortably on 2Lpm nasal cannula. Bipap not needed. SpO2-100% RR-18. RT will continue to monitor as needed.

## 2014-09-28 NOTE — Progress Notes (Signed)
Patient Name: Allison Macdonald Date of Encounter: 09/28/2014  Primary Cardiologist: Dr Gwen Pounds, Rosalita Levan   Principal Problem:   Stroke Active Problems:   Hypertension   Elevated troponin   COPD exacerbation   Atrial flutter   Acute respiratory failure   Generalized anxiety disorder   Essential hypertension    SUBJECTIVE  Denies ever having CP. No more SOB.  CURRENT MEDS . antiseptic oral rinse  7 mL Mouth Rinse q12n4p  . aspirin  300 mg Rectal Daily   Or  . aspirin  325 mg Oral Daily  . chlorhexidine  15 mL Mouth Rinse BID  . furosemide  40 mg Intravenous BID  . insulin aspart  0-9 Units Subcutaneous 6 times per day  . ipratropium-albuterol  3 mL Nebulization TID    OBJECTIVE  Filed Vitals:   09/28/14 0735 09/28/14 0743 09/28/14 1227 09/28/14 1400  BP:  105/48 149/70 126/62  Pulse:  62 63 66  Temp:  98.2 F (36.8 C) 98.9 F (37.2 C)   TempSrc:  Oral Oral   Resp:  Height:      Weight:      SpO2: 100% 99% 96% 99%    Intake/Output Summary (Last 24 hours) at 09/28/14 1619 Last data filed at 09/28/14 1400  Gross per 24 hour  Intake 313.53 ml  Output   2600 ml  Net -2286.47 ml   Filed Weights   09/26/14 1857 09/27/14 1033 09/28/14 0330  Weight: 193 lb 2 oz (87.6 kg) 189 lb 6 oz (85.9 kg) 184 lb 1.4 oz (83.5 kg)    PHYSICAL EXAM  General: Pleasant, NAD. Neuro: Alert and oriented X 3. Moves all extremities spontaneously. Psych: Normal affect. HEENT:  Normal  Neck: Supple without bruits or JVD. Lungs:  Resp regular and unlabored, CTA. Heart: irregular. no s3, s4, or murmurs. Abdomen: Soft, non-tender, non-distended, BS + x 4.  Extremities: No clubbing, cyanosis or edema. DP/PT/Radials 2+ and equal bilaterally.  Accessory Clinical Findings  CBC  Recent Labs  09/26/14 1847  09/27/14 0343 09/28/14 0255  WBC 10.5  --  8.0 6.3  NEUTROABS 5.5  --  5.1  --   HGB 12.9  < > 12.0 12.3  HCT 39.2  < > 37.9 38.1  MCV 93.1  --  93.3 92.7    PLT 229  --  210 216  < > = values in this interval not displayed. Basic Metabolic Panel  Recent Labs  09/26/14 1847 09/26/14 1854 09/27/14 0343  NA 138 140 139  K 4.1 4.0 4.3  CL 103 102 102  CO2 23  --  27  GLUCOSE 137* 142* 145*  BUN 20 22* 19  CREATININE 1.10* 1.00 0.88  CALCIUM 9.1  --  8.7*   Liver Function Tests  Recent Labs  09/26/14 1847 09/27/14 0343  AST 39 37  ALT 31 29  ALKPHOS 113 107  BILITOT 1.1 0.9  PROT 7.2 6.8  ALBUMIN 3.9 3.3*   Cardiac Enzymes  Recent Labs  09/26/14 2237 09/27/14 0343 09/27/14 0940  TROPONINI 0.47* 0.48* 0.36*   Hemoglobin A1C  Recent Labs  09/27/14 0343  HGBA1C 5.9*   Fasting Lipid Panel  Recent Labs  09/27/14 0343  CHOL 120  HDL 37*  LDLCALC 62  TRIG 161  CHOLHDL 3.2   Thyroid Function Tests  Recent Labs  09/27/14 0940  TSH 1.926    TELE A-fib with HR 50-60s    ECG  No new  EKG  Echocardiogram 09/27/2014  LV EF: 20% -  25%  ------------------------------------------------------------------- Indications:   CVA 436.  ------------------------------------------------------------------- History:  PMH: Elevated troponin. ARF. Atrial flutter. Risk factors: Hypertension.  ------------------------------------------------------------------- Study Conclusions  - Left ventricle: The cavity size was normal. Systolic function was severely reduced. The estimated ejection fraction was in the range of 20% to 25%. There is akinesis of the mid-apical anteroseptal, anterior, anterolateral, lateral, inferior, and apical myocardium. Has Takotsubo configuration . Doppler parameters are consistent with a reversible restrictive pattern, indicative of decreased left ventricular diastolic compliance and/or increased left atrial pressure (grade 3 diastolic dysfunction). - Mitral valve: There was mild regurgitation. - Right ventricle: The cavity size was moderately dilated.  Wall thickness was normal. Systolic function was moderately reduced. - Pulmonary arteries: Systolic pressure was mildly to moderately increased. PA peak pressure: 48 mm Hg (S).  Impressions:  - Takotsubo appearance. Correlates with low level troponin and T wave inversion on ECG.    Radiology/Studies  Dg Chest 2 View  09/26/2014   CLINICAL DATA:  A stroke.  Shortness of breath.  EXAM: CHEST  2 VIEW  COMPARISON:  12/15/2013  FINDINGS: Mild cardiac enlargement. Pulmonary vascularity appears normal. Interstitial changes in the lungs similar prior study suggesting chronic fibrosis. No focal airspace disease or consolidation. No blunting of costophrenic angles. No pneumothorax. Degenerative changes in the spine.  IMPRESSION: Cardiac enlargement. Chronic interstitial pattern. No evidence of active pulmonary disease.   Electronically Signed   By: Burman Nieves M.D.   On: 09/26/2014 22:57   Ct Head Wo Contrast  09/26/2014   CLINICAL DATA:  Left facial droop.  EXAM: CT HEAD WITHOUT CONTRAST  TECHNIQUE: Contiguous axial images were obtained from the base of the skull through the vertex without intravenous contrast.  COMPARISON:  None.  FINDINGS: Chronic microvascular ischemic change is noted. No evidence of acute intracranial abnormality including hemorrhage, infarct, mass lesion, mass effect, midline shift or abnormal extra-axial fluid collection is seen. No hydrocephalus or pneumocephalus. The calvarium is intact. Imaged paranasal sinuses demonstrate a very small mucous retention cyst or polyp in the right maxillary.  IMPRESSION: No acute abnormality.  Chronic microvascular ischemic change.   Electronically Signed   By: Drusilla Kanner M.D.   On: 09/26/2014 18:59   Dg Chest Port 1 View  09/27/2014   CLINICAL DATA:  Dyspnea  EXAM: PORTABLE CHEST - 1 VIEW  COMPARISON:  09/26/2014  FINDINGS: Cardiomegaly again noted. Stable mild interstitial prominence bilateral without convincing pulmonary edema.  Slight worsening bilateral basilar hazy atelectasis or infiltrate right greater than left. Question small right pleural effusion.  IMPRESSION: Stable mild interstitial prominence bilateral without convincing pulmonary edema. Slight worsening bilateral basilar hazy atelectasis or infiltrate right greater than left. Question small right pleural effusion.   Electronically Signed   By: Natasha Mead M.D.   On: 09/27/2014 08:23     ASSESSMENT AND PLAN  81 yo female w/ hx HTN, dementia admitted as code stroke 06/13. Cards seeing for atrial flutter, T wave inversions and elevated troponin.  1. Acute systolic HF on chronic diastolic HF  - Continue on IV lasix for now, difficult to assess fluid level, likely can transition to PO lasix soon. CXR shows ?atelectasis  - check BMET in AM, if Cr trending, will transition to PO lasix  - consider restart ACEI and add low dose coreg, need to repeat echo in few weeks, if no improvement, may need ischemic workup  2. Likely takotsubo's cardiomyopathy  - 20% to  25%. akinesis of the mid-apical, anteroseptal, anterior, anterolateral, lateral, inferior, and apical myocardium. Has Takotsubo configuration   3. Atrial flutter: has recent fall, will need to reassess at some point if need anticoagulation. Currently on IV heparin. Currently rate controlled without any rate control medication  4. Mildly elevated trop: likely related to Takotsubo  5. HTN  6. Possible stroke vs TIA  Signed, Azalee Course PA-C Pager: [phone]  Patient seen with PA, agree with the above note.  1. CVA (versus TIA): Unable to get MRI.  Likely related to atrial fibrillation.  She is on heparin gtt and ASA.  She should be transitioned over to NOAC eventually, could use Eliquis 5 mg bid.  2. Acute systolic CHF: EF 32-95% with strong suspicion for Takotsubo cardiomyopathy related to her CVA.  She still has some mild volume excess on exam.  - Continue Lasix 40 mg IV bid, will need daily BMET.  -  Continue daily weight/strict I/Os.  - Given suspected Takotsubo, start Coreg 3.125 mg bid today.  If BP stable, can start low dose ACEI tomorrow.  She is a couple of days out from CVA now, think ok to begin cardiac meds.  - Repeat echo in a couple of months.  No chest pain, think we can hold off on cardiac cath for the time being.  3. Coarse atrial fibrillation/atypical flutter: As above, would start Eliquis when ok with neurology.  Would be reasonable to anticoagulate for a month and cardiovert if she remains in atrial fibrillation as I suspect it contributes to her CHF.  4. Mild troponin elevation: Associated with TWIs.  This may be part of her Takotsubo syndrome.  As above, given lack of CP, would hold off on cardiac cath.  Cardiolite as outpatient would be reasonable.   Marca Ancona 09/28/2014

## 2014-09-28 NOTE — Progress Notes (Addendum)
[facility] PROGRESS NOTE  Allison Macdonald HYI:[id number] DOB: Feb 07, 1927 DOA: 09/26/2014 PCP: No primary care provider on file.  Assessment/Plan:  Principal Problem:   Stroke:  Could not tolerate MRI. Reports even being unable to even tolerate open MRI several years ago. Repeat CAT scan for today per neurology.  Speech therapy pending. Currently nothing by mouth. Hopefully, can start diet today. Still with left facial droop, but no focal weakness otherwise. Can start getting up with therapy as respiratory status improved today. Remains on aspirin. Echo Left ventricle: The cavity size was normal. Systolic function was severely reduced. The estimated ejection fraction was in the range of 20% to 25%. There is akinesis of the mid-apical anteroseptal, anterior, anterolateral, lateral, inferior, and apical myocardium. Has Takotsubo configuration . Doppler parameters are consistent with a reversible restrictive pattern, indicative of decreased left ventricular diastolic compliance and/or increased left atrial pressure (grade 3 diastolic dysfunction). - Mitral valve: There was mild regurgitation. - Right ventricle: The cavity size was moderately dilated. Wall thickness was normal. Systolic function was moderately reduced. - Pulmonary arteries: Systolic pressure was mildly to moderately increased. PA peak pressure: 48 mm Hg (S). Impressions: - Takotsubo appearance. Correlates with low level troponin and T wave inversion on ECG.  Carotid Dopplers Preliminary results by tech - Carotid Duplex Completed. Mild plaque visualized in both ICA with no evidence of stenosis noted.   LDL 62, hemoglobin A1c 5.7  Active Problems:    Acute respiratory failure:  Given chest x-ray findings, echocardiogram findings, BNP above the thousand and rapid improvement, likely related to acute pulmonary edema from takatsubo cardiomyopathy. Currently on nasal cannula oxygen. Continue  step down monitoring for today. Wean oxygen as able. Will stop steroids. Continue bronchodilators.  Nevertheless, remains nothing by mouth in case aspiration component.  Cardiomyopathy, Takatsubo.  Will defer medication management to cardiology. On ACE inhibitor as an outpatient which is currently held.    COPD exacerbation: Was wheezing yesterday with prolonged expiratory phase. No wheeze today. See above.    Elevated troponin: trend flat. EKG is abnormal with fairly diffuse T-wave inversions, consistent with Takatsubo cM    Atrial flutter:  No known previous history of same. On heparin drip. TSH normal.    Generalized anxiety disorder:  Continue Ativan IV as needed    Essential hypertension:  meds held   Code Status:  limited: DO NOT INTUBATE  Family Communication:  Son by phone 6/15 Disposition Plan:  Await therapies  Consults Neurology Cardiology  Procedures:     Antibiotics:    HPI/Subjective: Breathing much better today. Would like some ice chips. Refuses repeat attempt at MRI. See above.  Objective: Filed Vitals:   09/28/14 0743  BP: 105/48  Pulse: 62  Temp: 98.2 F (36.8 C)  Resp: 18    Intake/Output Summary (Last 24 hours) at 09/28/14 0820 Last data filed at 09/28/14 0700  Gross per 24 hour  Intake  150.5 ml  Output   2100 ml  Net -1949.5 ml   Filed Weights   09/26/14 1857 09/27/14 1033 09/28/14 0330  Weight: 87.6 kg (193 lb 2 oz) 85.9 kg (189 lb 6 oz) 83.5 kg (184 lb 1.4 oz)   Telemetry: Currently off getting a bath. Per nursing staff, in and out of a flutter sinus rhythm overnight.  Exam:   General:   Weak appearing. Breathing much improved. Appropriate. On 2 L nasal cannula oxygen  Cardiovascular:  regular rate rhythm without murmurs gallops rubs   Respiratory:  Clear to  auscultation bilaterally without wheeze rhonchi or rales  Abdomen:  soft nontender nondistended  Ext:  no edema  Neurologic: Cranial nerves: Left facial droop present.  No drooling present today. Motor strength 5 out of 5 in all extremities.  Basic Metabolic Panel:  Recent Labs Lab 09/26/14 1847 09/26/14 1854 09/27/14 0343  NA 138 140 139  K 4.1 4.0 4.3  CL 103 102 102  CO2 23  --  27  GLUCOSE 137* 142* 145*  BUN 20 22* 19  CREATININE 1.10* 1.00 0.88  CALCIUM 9.1  --  8.7*   Liver Function Tests:  Recent Labs Lab 09/26/14 1847 09/27/14 0343  AST 39 37  ALT 31 29  ALKPHOS 113 107  BILITOT 1.1 0.9  PROT 7.2 6.8  ALBUMIN 3.9 3.3*   No results for input(s): LIPASE, AMYLASE in the last 168 hours. No results for input(s): AMMONIA in the last 168 hours. CBC:  Recent Labs Lab 09/26/14 1847 09/26/14 1854 09/27/14 0343 09/28/14 0255  WBC 10.5  --  8.0 6.3  NEUTROABS 5.5  --  5.1  --   HGB 12.9 14.6 12.0 12.3  HCT 39.2 43.0 37.9 38.1  MCV 93.1  --  93.3 92.7  PLT 229  --  210 216   Cardiac Enzymes:  Recent Labs Lab 09/26/14 2237 09/27/14 0343 09/27/14 0940  TROPONINI 0.47* 0.48* 0.36*   BNP (last 3 results)  Recent Labs  09/27/14 0940  BNP 1915.4*    ProBNP (last 3 results) No results for input(s): PROBNP in the last 8760 hours.  CBG:  Recent Labs Lab 09/27/14 1130 09/27/14 1622 09/27/14 2000 09/28/14 0050 09/28/14 0401  GLUCAP 140* 178* 144* 151* 150*    No results found for this or any previous visit (from the past 240 hour(s)).   Studies: Dg Chest 2 View  09/26/2014   CLINICAL DATA:  A stroke.  Shortness of breath.  EXAM: CHEST  2 VIEW  COMPARISON:  12/15/2013  FINDINGS: Mild cardiac enlargement. Pulmonary vascularity appears normal. Interstitial changes in the lungs similar prior study suggesting chronic fibrosis. No focal airspace disease or consolidation. No blunting of costophrenic angles. No pneumothorax. Degenerative changes in the spine.  IMPRESSION: Cardiac enlargement. Chronic interstitial pattern. No evidence of active pulmonary disease.   Electronically Signed   By: Burman Nieves M.D.   On:  09/26/2014 22:57   Ct Head Wo Contrast  09/26/2014   CLINICAL DATA:  Left facial droop.  EXAM: CT HEAD WITHOUT CONTRAST  TECHNIQUE: Contiguous axial images were obtained from the base of the skull through the vertex without intravenous contrast.  COMPARISON:  None.  FINDINGS: Chronic microvascular ischemic change is noted. No evidence of acute intracranial abnormality including hemorrhage, infarct, mass lesion, mass effect, midline shift or abnormal extra-axial fluid collection is seen. No hydrocephalus or pneumocephalus. The calvarium is intact. Imaged paranasal sinuses demonstrate a very small mucous retention cyst or polyp in the right maxillary.  IMPRESSION: No acute abnormality.  Chronic microvascular ischemic change.   Electronically Signed   By: Drusilla Kanner M.D.   On: 09/26/2014 18:59   Dg Chest Port 1 View  09/27/2014   CLINICAL DATA:  Dyspnea  EXAM: PORTABLE CHEST - 1 VIEW  COMPARISON:  09/26/2014  FINDINGS: Cardiomegaly again noted. Stable mild interstitial prominence bilateral without convincing pulmonary edema. Slight worsening bilateral basilar hazy atelectasis or infiltrate right greater than left. Question small right pleural effusion.  IMPRESSION: Stable mild interstitial prominence bilateral without convincing pulmonary edema. Slight  worsening bilateral basilar hazy atelectasis or infiltrate right greater than left. Question small right pleural effusion.   Electronically Signed   By: Natasha Mead M.D.   On: 09/27/2014 08:23    Scheduled Meds: . antiseptic oral rinse  7 mL Mouth Rinse q12n4p  . aspirin  300 mg Rectal Daily   Or  . aspirin  325 mg Oral Daily  . chlorhexidine  15 mL Mouth Rinse BID  . furosemide  40 mg Intravenous BID  . insulin aspart  0-9 Units Subcutaneous 6 times per day  . ipratropium-albuterol  3 mL Nebulization TID  . methylPREDNISolone (SOLU-MEDROL) injection  60 mg Intravenous Q12H   Continuous Infusions: . heparin 1,200 Units/hr (09/27/14 1851)     Time 35 ,min  Loyde Orth L  [facility] Pager [phone]. If 7PM-7AM, please contact night-coverage at [email or web address], password [facility] 09/28/2014, 8:20 AM  LOS: 2 days

## 2014-09-29 DIAGNOSIS — J441 Chronic obstructive pulmonary disease with (acute) exacerbation: Secondary | ICD-10-CM

## 2014-09-29 DIAGNOSIS — R1314 Dysphagia, pharyngoesophageal phase: Secondary | ICD-10-CM

## 2014-09-29 DIAGNOSIS — I483 Typical atrial flutter: Secondary | ICD-10-CM

## 2014-09-29 DIAGNOSIS — I639 Cerebral infarction, unspecified: Secondary | ICD-10-CM

## 2014-09-29 DIAGNOSIS — E876 Hypokalemia: Secondary | ICD-10-CM

## 2014-09-29 LAB — BASIC METABOLIC PANEL
ANION GAP: 12 (ref 5–15)
BUN: 38 mg/dL — ABNORMAL HIGH (ref 6–20)
CALCIUM: 9 mg/dL (ref 8.9–10.3)
CO2: 35 mmol/L — ABNORMAL HIGH (ref 22–32)
Chloride: 93 mmol/L — ABNORMAL LOW (ref 101–111)
Creatinine, Ser: 1.04 mg/dL — ABNORMAL HIGH (ref 0.44–1.00)
GFR calc Af Amer: 55 mL/min — ABNORMAL LOW (ref >60)
GFR, EST NON AFRICAN AMERICAN: 48 mL/min — AB (ref >60)
GLUCOSE: 118 mg/dL — AB (ref 65–99)
Potassium: 3.2 mmol/L — ABNORMAL LOW (ref 3.5–5.1)
Sodium: 140 mmol/L (ref 135–145)

## 2014-09-29 LAB — CBC
HEMATOCRIT: 41.8 % (ref 36.0–46.0)
Hemoglobin: 13.4 g/dL (ref 12.0–15.0)
MCH: 30 pg (ref 26.0–34.0)
MCHC: 32.1 g/dL (ref 30.0–36.0)
MCV: 93.5 fL (ref 78.0–100.0)
Platelets: 293 10*3/uL (ref 150–400)
RBC: 4.47 MIL/uL (ref 3.87–5.11)
RDW: 14 % (ref 11.5–15.5)
WBC: 12.2 10*3/uL — AB (ref 4.0–10.5)

## 2014-09-29 LAB — GLUCOSE, CAPILLARY
GLUCOSE-CAPILLARY: 107 mg/dL — AB (ref 65–99)
GLUCOSE-CAPILLARY: 122 mg/dL — AB (ref 65–99)
GLUCOSE-CAPILLARY: 113 mg/dL — AB (ref 65–99)
Glucose-Capillary: 102 mg/dL — ABNORMAL HIGH (ref 65–99)
Glucose-Capillary: 119 mg/dL — ABNORMAL HIGH (ref 65–99)

## 2014-09-29 LAB — HEPARIN LEVEL (UNFRACTIONATED): HEPARIN UNFRACTIONATED: 0.35 [IU]/mL (ref 0.30–0.70)

## 2014-09-29 LAB — MAGNESIUM: MAGNESIUM: 1.7 mg/dL (ref 1.7–2.4)

## 2014-09-29 MED ORDER — HYDROCODONE-ACETAMINOPHEN 5-325 MG PO TABS
1.0000 | ORAL_TABLET | Freq: Four times a day (QID) | ORAL | Status: DC | PRN
  Administered 2014-09-29 – 2014-09-30 (×3): 2 via ORAL
  Filled 2014-09-29 (×3): qty 2

## 2014-09-29 MED ORDER — LORAZEPAM 1 MG PO TABS: 1.0000 mg | ORAL_TABLET | Freq: Two times a day (BID) | ORAL | Status: DC | PRN

## 2014-09-29 MED ORDER — POTASSIUM CHLORIDE CRYS ER 10 MEQ PO TBCR
10.0000 meq | EXTENDED_RELEASE_TABLET | Freq: Three times a day (TID) | ORAL | Status: DC
  Administered 2014-09-29 – 2014-09-30 (×3): 10 meq via ORAL
  Filled 2014-09-29 (×5): qty 1

## 2014-09-29 MED ORDER — POTASSIUM CHLORIDE CRYS ER 20 MEQ PO TBCR
20.0000 meq | EXTENDED_RELEASE_TABLET | Freq: Once | ORAL | Status: AC
  Administered 2014-09-29: 20 meq via ORAL
  Filled 2014-09-29: qty 1

## 2014-09-29 MED ORDER — SERTRALINE HCL 50 MG PO TABS
75.0000 mg | ORAL_TABLET | Freq: Every day | ORAL | Status: DC
  Administered 2014-09-29 – 2014-09-30 (×2): 75 mg via ORAL
  Filled 2014-09-29: qty 1
  Filled 2014-09-29: qty 2

## 2014-09-29 MED ORDER — RESOURCE THICKENUP CLEAR PO POWD
ORAL | Status: DC | PRN
  Filled 2014-09-29: qty 125

## 2014-09-29 MED ORDER — APIXABAN 5 MG PO TABS
5.0000 mg | ORAL_TABLET | Freq: Two times a day (BID) | ORAL | Status: DC
  Administered 2014-09-29 – 2014-09-30 (×3): 5 mg via ORAL
  Filled 2014-09-29 (×4): qty 1

## 2014-09-29 NOTE — Consult Note (Signed)
Physical Medicine and Rehabilitation Consult Reason for Consult: Suspect right PCA infarct Referring Physician: [facility]   HPI: Allison Macdonald is a 81 y.o. right handed female with history of hypertension, COPD, chronic back pain. Patient independent prior to admission living with son and still driving. Recent fall 2 days prior sustaining a small left periorbital hematoma. Presented 09/26/2014 with left facial droop and altered mental status. CT of the head showed no acute abnormalities. Patient did not receive TPA. Echocardiogram with ejection fraction of 25% grade 3 diastolic dysfunction. Carotid Dopplers with no ICA stenosis. Neurology consulted with workup presently ongoing maintained on aspirin as well as intravenous heparin. EKG showed elevated troponin 0.47 as well as abnormal EKG with deep T-wave inversions. Cardiology service is consulted. Placed on intravenous Lasix for acute systolic congestive heart failure as well as review of echocardiogram likely takotsubo cardiomyopathy. Runs of atrial flutter advised to maintain intravenous heparin for now and await planned for possible Eliquis. No current plan for cardiac catheterization. Dysphagia #2 thin liquid diet followed by speech therapy. Physical therapy evaluation completed 09/28/2014 with recommendations of physical medicine rehabilitation consult.   Review of Systems  Constitutional: Negative for fever and chills.  HENT: Negative for congestion and hearing loss.   Eyes: Negative for blurred vision and double vision.  Respiratory: Negative for cough and shortness of breath.   Cardiovascular: Positive for palpitations and leg swelling.  Gastrointestinal: Positive for nausea and constipation. Negative for heartburn and vomiting.  Musculoskeletal: Positive for myalgias, back pain, joint pain and falls.  Neurological: Positive for dizziness and headaches.  Psychiatric/Behavioral: Positive for depression.       Anxiety   Past  Medical History  Diagnosis Date  . Anxiety   . Hypertension    Past Surgical History  Procedure Laterality Date  . Back surgery    . Abdominal hysterectomy     History reviewed. No pertinent family history. Social History:  reports that she has never smoked. She does not have any smokeless tobacco history on file. She reports that she does not drink alcohol or use illicit drugs. Allergies:  Allergies  Allergen Reactions  . Penicillins    Medications Prior to Admission  Medication Sig Dispense Refill  . Diclofenac-Misoprostol 75-0.2 MG TBEC Take 1 tablet by mouth daily.    Marland Kitchen estradiol (ESTRACE) 0.5 MG tablet Take 0.5 mg by mouth daily.    Marland Kitchen HYDROcodone-acetaminophen (NORCO/VICODIN) 5-325 MG per tablet Take 1-2 tablets by mouth every 6 (six) hours as needed for moderate pain.     Marland Kitchen lisinopril (PRINIVIL,ZESTRIL) 5 MG tablet Take 5 mg by mouth daily.    Marland Kitchen LORazepam (ATIVAN) 1 MG tablet Take 1 mg by mouth 2 (two) times daily as needed for anxiety.     . sertraline (ZOLOFT) 50 MG tablet Take 75 mg by mouth daily.      Home: Home Living Family/patient expects to be discharged to:: Private residence Living Arrangements: Children (son) Available Help at Discharge: Family, Available 24 hours/day Type of Home: House Home Access: Ramped entrance Home Layout: One level Home Equipment: Grab bars - toilet, Grab bars - tub/shower  Lives With: Son  Functional History: Prior Function Level of Independence: Independent Comments: ambulated with no device, driving, performs all ADL and IADL Functional Status:  Mobility: Bed Mobility Overal bed mobility: Needs Assistance Bed Mobility: Supine to Sit Supine to sit: Min guard General bed mobility comments: Used bed rails to get to EOB Transfers Overall transfer level: Needs  assistance Equipment used: Rolling walker (2 wheeled) Transfers: Sit to/from Stand Sit to Stand: Min guard General transfer comment: increase time to stand, steadying  assist Ambulation/Gait Ambulation/Gait assistance: Min assist Ambulation Distance (Feet): 100 Feet Assistive device: Rolling walker (2 wheeled) General Gait Details: Pt tracked toward the left consistently throughout gait session. Required verbal and tactile cues to look where she was going, stay inside the walker, and keep a straight path. assist to navigate around obstacles. Gait Pattern/deviations: Step-through pattern, Decreased stride length, Staggering left, Drifts right/left, Narrow base of support Gait velocity: Decreased Gait velocity interpretation: Below normal speed for age/gender    ADL: ADL Overall ADL's : Needs assistance/impaired Eating/Feeding: Supervision/ safety, Sitting Eating/Feeding Details (indicate cue type and reason): pt cues herself for swallowing precautions but does not always generalize Grooming: Wash/dry hands, Wash/dry face, Sitting, Minimal assistance Upper Body Bathing: Minimal assitance, Sitting Lower Body Bathing: Minimal assistance, Sit to/from stand Upper Body Dressing : Minimal assistance, Sitting Lower Body Dressing: Minimal assistance, Sit to/from stand Lower Body Dressing Details (indicate cue type and reason): able to cross foot over opposite knee to access socks/feet  Cognition: Cognition Overall Cognitive Status: Impaired/Different from baseline Arousal/Alertness: Awake/alert Orientation Level: Oriented X4 Attention: Sustained Sustained Attention: Appears intact Memory: Impaired Memory Impairment: Retrieval deficit, Storage deficit, Decreased short term memory Decreased Short Term Memory: Verbal basic Awareness: Impaired Awareness Impairment: Anticipatory impairment, Emergent impairment Problem Solving:  (overall fucntional for verbal, suspect difficulty during act) Cognition Arousal/Alertness: Awake/alert Behavior During Therapy: Impulsive, Anxious (asking for anxiety medication) Overall Cognitive Status: Impaired/Different from  baseline Area of Impairment: Problem solving, Safety/judgement Safety/Judgement: Decreased awareness of safety, Decreased awareness of deficits Problem Solving: Requires verbal cues, Requires tactile cues, Difficulty sequencing General Comments: pt unable to find items on L side and kept running into things on the L  Blood pressure 91/74, pulse 76, temperature 97.8 F (36.6 C), temperature source Oral, resp. rate 18, height 5' 3.5" (1.613 m), weight 81.5 kg (179 lb 10.8 oz), SpO2 90 %. Physical Exam  Constitutional: She is oriented to person, place, and time.  HENT:  Head: Normocephalic.  Eyes:  Pupils round and reactive to light  Neck: Normal range of motion. Neck supple. No thyromegaly present.  Cardiovascular:  Cardiac rate controlled  Respiratory: Effort normal and breath sounds normal. No respiratory distress.  GI: Soft. Bowel sounds are normal. She exhibits no distension.  Neurological: She is alert and oriented to person, place, and time.  Mood is flat but appropriate. Fair awareness of deficits  Speech dysarthric, low volume. Strength grossly 4/5 proximal to distal in upper ext.  Lower ext 3/5 prox to 4/5 distal.  No sensory deficits seen. Appears to have reasonable insight and awareness  Skin: Skin is warm and dry.  Psychiatric: She has a normal mood and affect.    Results for orders placed or performed during the hospital encounter of 09/26/14 (from the past 24 hour(s))  Glucose, capillary     Status: Abnormal   Collection Time: 09/28/14  7:43 AM  Result Value Ref Range   Glucose-Capillary 143 (H) 65 - 99 mg/dL  Glucose, capillary     Status: Abnormal   Collection Time: 09/28/14 12:25 PM  Result Value Ref Range   Glucose-Capillary 155 (H) 65 - 99 mg/dL  Glucose, capillary     Status: Abnormal   Collection Time: 09/28/14  4:24 PM  Result Value Ref Range   Glucose-Capillary 131 (H) 65 - 99 mg/dL  Glucose, capillary  Status: Abnormal   Collection Time: 09/28/14  9:05  PM  Result Value Ref Range   Glucose-Capillary 116 (H) 65 - 99 mg/dL  MRSA PCR Screening     Status: None   Collection Time: 09/28/14  9:58 PM  Result Value Ref Range   MRSA by PCR NEGATIVE NEGATIVE  Glucose, capillary     Status: Abnormal   Collection Time: 09/29/14 12:28 AM  Result Value Ref Range   Glucose-Capillary 119 (H) 65 - 99 mg/dL  CBC     Status: Abnormal   Collection Time: 09/29/14  3:42 AM  Result Value Ref Range   WBC 12.2 (H) 4.0 - 10.5 K/uL   RBC 4.47 3.87 - 5.11 MIL/uL   Hemoglobin 13.4 12.0 - 15.0 g/dL   HCT 24.4 01.0 - 27.2 %   MCV 93.5 78.0 - 100.0 fL   MCH 30.0 26.0 - 34.0 pg   MCHC 32.1 30.0 - 36.0 g/dL   RDW 53.6 64.4 - 03.4 %   Platelets 293 150 - 400 K/uL  Heparin level (unfractionated)     Status: None   Collection Time: 09/29/14  3:42 AM  Result Value Ref Range   Heparin Unfractionated 0.35 0.30 - 0.70 IU/mL  Glucose, capillary     Status: Abnormal   Collection Time: 09/29/14  4:10 AM  Result Value Ref Range   Glucose-Capillary 107 (H) 65 - 99 mg/dL   Dg Chest Port 1 View  09/27/2014   CLINICAL DATA:  Dyspnea  EXAM: PORTABLE CHEST - 1 VIEW  COMPARISON:  09/26/2014  FINDINGS: Cardiomegaly again noted. Stable mild interstitial prominence bilateral without convincing pulmonary edema. Slight worsening bilateral basilar hazy atelectasis or infiltrate right greater than left. Question small right pleural effusion.  IMPRESSION: Stable mild interstitial prominence bilateral without convincing pulmonary edema. Slight worsening bilateral basilar hazy atelectasis or infiltrate right greater than left. Question small right pleural effusion.   Electronically Signed   By: Natasha Mead M.D.   On: 09/27/2014 08:23   Dg Swallowing Func-speech Pathology  09/28/2014    Objective Swallowing Evaluation:   Modified Barium Swallow Patient Details  Name: AURORAH SCHLACHTER MRN: [id number] Date of Birth: [birth year]  Today's Date: 09/28/2014 Time: SLP Start Time (ACUTE ONLY): 1125-SLP  Stop Time (ACUTE ONLY): 1145 SLP Time Calculation (min) (ACUTE ONLY): 20 min  Past Medical History:  Past Medical History  Diagnosis Date  . Anxiety   . Hypertension    Past Surgical History:  Past Surgical History  Procedure Laterality Date  . Back surgery    . Abdominal hysterectomy     HPI:  Other Pertinent Information: 81 y.o. female with history of hypertension,   COPD, HTN, anxiety  and chronic back pain was brought to the ER after  patient was found to be having increasing confusion and left facial droop.  CT no acute abnormality. Chronic microvascular ischemic change. Per chart  patient also had a fall 2 days ago which patient states she tripped and  fell and has a small left peri-orbital hematoma. Pt unable to tolerate  MRI. Respiratory distress 6/14 with suspected aspiration, put on Bipap.  CXR stable mild interstitial prominence bilateral without convincing  pulmonary edema. Slight worsening bilateral basilar hazy atelectasis or  infiltrate right greater than left. Question small right pleural effusion.  Indications aspiration during bedside.  No Data Recorded  Assessment / Plan / Recommendation CHL IP CLINICAL IMPRESSIONS 09/28/2014  Therapy Diagnosis Mild pharyngeal phase dysphagia;Mild oral phase  dysphagia  Clinical  Impression Pt. demonstrated mild oropharyngeal dysphagia with  decreased oral cohesion and ability to fully clear oral cavity resulting  in post swallow spill to valleculae and pyriform sinuses. Intermittent  penetration with thin (flash x 1 and deep) resulting in strong cough  reflex. Penetration not observed with therapeutic strategy using chin  tuck. Minimal sporadic vallecular residue due to mildly decreased tongue  based retraction. SLP recommends Dys 2, thin liquids, chin tuck with  liquids, pills whole in applesauce, full supervision for strategy and  continue ST intervention and education.      CHL IP TREATMENT RECOMMENDATION 09/28/2014  Treatment Recommendations Therapy as outlined  in treatment plan below     CHL IP DIET RECOMMENDATION 09/28/2014  SLP Diet Recommendations Dysphagia 2 (Fine chop);Thin  Liquid Administration via (None)  Medication Administration Whole meds with puree  Compensations Slow rate;Small sips/bites;Chin tuck  Postural Changes and/or Swallow Maneuvers (None)     CHL IP OTHER RECOMMENDATIONS 09/28/2014  Recommended Consults (None)  Oral Care Recommendations Oral care BID  Other Recommendations (None)    CHL IP FOLLOW UP RECOMMENDATIONS 09/28/2014  Follow up Recommendations Inpatient Rehab     CHL IP FREQUENCY AND DURATION 09/28/2014  Speech Therapy Frequency (ACUTE ONLY) min 2x/week  Treatment Duration 2 weeks     Pertinent Vitals/Pain none    SLP Swallow Goals No flowsheet data found.  No flowsheet data found.    CHL IP REASON FOR REFERRAL 09/28/2014  Reason for Referral Objectively evaluate swallowing function     CHL IP ORAL PHASE 09/28/2014  Lips (None)  Tongue (None)  Mucous membranes (None)  Nutritional status (None)  Other (None)  Oxygen therapy (None)  Oral Phase Impaired  Oral - Pudding Teaspoon (None)  Oral - Pudding Cup (None)  Oral - Honey Teaspoon (None)  Oral - Honey Cup (None)  Oral - Honey Syringe (None)  Oral - Nectar Teaspoon (None)  Oral - Nectar Cup (None)  Oral - Nectar Straw (None)  Oral - Nectar Syringe (None)  Oral - Ice Chips (None)  Oral - Thin Teaspoon (None)  Oral - Thin Cup (None)  Oral - Thin Straw (None)  Oral - Thin Syringe (None)  Oral - Puree (None)  Oral - Mechanical Soft (None)  Oral - Regular (None)  Oral - Multi-consistency (None)  Oral - Pill (None)  Oral Phase - Comment (None)      CHL IP PHARYNGEAL PHASE 09/28/2014  Pharyngeal Phase Impaired  Pharyngeal - Pudding Teaspoon (None)  Penetration/Aspiration details (pudding teaspoon) (None)  Pharyngeal - Pudding Cup (None)  Penetration/Aspiration details (pudding cup) (None)  Pharyngeal - Honey Teaspoon (None)  Penetration/Aspiration details (honey teaspoon) (None)  Pharyngeal - Honey Cup  (None)  Penetration/Aspiration details (honey cup) (None)  Pharyngeal - Honey Syringe (None)  Penetration/Aspiration details (honey syringe) (None)  Pharyngeal - Nectar Teaspoon (None)  Penetration/Aspiration details (nectar teaspoon) (None)  Pharyngeal - Nectar Cup (None)  Penetration/Aspiration details (nectar cup) (None)  Pharyngeal - Nectar Straw (None)  Penetration/Aspiration details (nectar straw) (None)  Pharyngeal - Nectar Syringe (None)  Penetration/Aspiration details (nectar syringe) (None)  Pharyngeal - Ice Chips (None)  Penetration/Aspiration details (ice chips) (None)  Pharyngeal - Thin Teaspoon (None)  Penetration/Aspiration details (thin teaspoon) (None)  Pharyngeal - Thin Cup (None)  Penetration/Aspiration details (thin cup) (None)  Pharyngeal - Thin Straw (None)  Penetration/Aspiration details (thin straw) (None)  Pharyngeal - Thin Syringe (None)  Penetration/Aspiration details (thin syringe') (None)  Pharyngeal - Puree (None)  Penetration/Aspiration details (puree) (None)  Pharyngeal - Mechanical Soft (None)  Penetration/Aspiration details (mechanical soft) (None)  Pharyngeal - Regular (None)  Penetration/Aspiration details (regular) (None)  Pharyngeal - Multi-consistency (None)  Penetration/Aspiration details (multi-consistency) (None)  Pharyngeal - Pill (None)  Penetration/Aspiration details (pill) (None)  Pharyngeal Comment (None)      CHL IP CERVICAL ESOPHAGEAL PHASE 09/28/2014  Cervical Esophageal Phase WFL  Pudding Teaspoon (None)  Pudding Cup (None)  Honey Teaspoon (None)  Honey Cup (None)  Honey Straw (None)  Nectar Teaspoon (None)  Nectar Cup (None)  Nectar Straw (None)  Nectar Sippy Cup (None)  Thin Teaspoon (None)  Thin Cup (None)  Thin Straw (None)  Thin Sippy Cup (None)  Cervical Esophageal Comment (None)    No flowsheet data found.         Royce Macadamia 09/28/2014, 12:19 PM  Breck Coons Lonell Face.Ed CCC-SLP Pager [phone]      Assessment/Plan: Diagnosis: likely right PCA  infarct 1. Does the need for close, 24 hr/day medical supervision in concert with the patient's rehab needs make it unreasonable for this patient to be served in a less intensive setting? Potentially 2. Co-Morbidities requiring supervision/potential complications: htn, aflutter 3. Due to bladder management, bowel management, safety, skin/wound care, disease management, medication administration, pain management and patient education, does the patient require 24 hr/day rehab nursing? Potentially 4. Does the patient require coordinated care of a physician, rehab nurse, PT (1-2 hrs/day, 5 days/week), OT (1-2 hrs/day, 5 days/week) and SLP (1-2 hrs/day, 5 days/week) to address physical and functional deficits in the context of the above medical diagnosis(es)? No Addressing deficits in the following areas: balance, endurance, locomotion, strength, transferring, bowel/bladder control, bathing, dressing, feeding, grooming, toileting, speech and swallowing 5. Can the patient actively participate in an intensive therapy program of at least 3 hrs of therapy per day at least 5 days per week? Yes 6. The potential for patient to make measurable gains while on inpatient rehab is fair 7. Anticipated functional outcomes upon discharge from inpatient rehab are n/a  with PT, n/a with OT, n/a with SLP. 8. Estimated rehab length of stay to reach the above functional goals is: n/a 9. Does the patient have adequate social supports and living environment to accommodate these discharge functional goals? Potentially 10. Anticipated D/C setting: Home 11. Anticipated post D/C treatments: HH therapy 12. Overall Rehab/Functional Prognosis: excellent  RECOMMENDATIONS: This patient's condition is appropriate for continued rehabilitative care in the following setting: [facility] Therapy Patient has agreed to participate in recommended program. Yes Note that insurance prior authorization may be required for reimbursement for recommended  care.  Comment: Pt lives with son. She prefers to go home. She is at a min assist to contact guard assist level already. Recommend HH when medically stable for dc.  Ranelle Oyster, MD, [facility] Physical Medicine & Rehabilitation 09/29/2014     09/29/2014

## 2014-09-29 NOTE — Progress Notes (Signed)
Pt received via w/c alert, verbal with no noted distress. Pt ambulated from the w/c to the chair to sit up for dinner. Pt stable, neuro intact. Pt oriented to room. Safety measures in place. Call bell within reach. Will continue to monitor. Report received prior to transfer.

## 2014-09-29 NOTE — Progress Notes (Signed)
Patient Name: Allison Macdonald Date of Encounter: 09/29/2014  Principal Problem:   Stroke Active Problems:   Hypertension   Elevated troponin   COPD exacerbation   Atrial flutter   Acute respiratory failure   Generalized anxiety disorder   Essential hypertension   Acute on chronic systolic CHF (congestive heart failure)   Primary Cardiologist: Dr Ilda Foil  Patient Profile: 81 yo female w/ hx HTN, dementia admitted as code stroke 06/13. Cards seeing for atrial flutter, T wave inversions and elevated troponin.   SUBJECTIVE: Somonlent (had Ativan a few hours ago), but rouses to verbal, no complaints  OBJECTIVE Filed Vitals:   09/28/14 2130 09/28/14 2327 09/29/14 0350 09/29/14 0736  BP:  91/74  137/67  Pulse:  72 76 65  Temp:  98 F (36.7 C) 97.8 F (36.6 C)   TempSrc:  Oral Oral   Resp:  Height:   5' 3.5" (1.613 m)   Weight:   179 lb 10.8 oz (81.5 kg)   SpO2: 100% 93% 90% 100%    Intake/Output Summary (Last 24 hours) at 09/29/14 0752 Last data filed at 09/29/14 0030  Gross per 24 hour  Intake 361.53 ml  Output   2525 ml  Net -2163.47 ml   Filed Weights   09/27/14 1033 09/28/14 0330 09/29/14 0350  Weight: 189 lb 6 oz (85.9 kg) 184 lb 1.4 oz (83.5 kg) 179 lb 10.8 oz (81.5 kg)    PHYSICAL EXAM General: Well developed, well nourished, female in no acute distress. Head: Normocephalic, atraumatic.  Neck: Supple without bruits, JVD not elevated. Lungs:  Resp regular and unlabored, rales bases. Heart: almost regular R&R, S1, S2, no S3, S4, or murmur; no rub. Abdomen: Soft, non-tender, non-distended, BS + x 4.  Extremities: No clubbing, cyanosis, no edema.  Neuro: Alert and oriented X 3. Moves all extremities spontaneously. Psych: Normal affect.  LABS: CBC: Recent Labs  09/26/14 1847  09/27/14 0343 09/28/14 0255 09/29/14 0342  WBC 10.5  --  8.0 6.3 12.2*  NEUTROABS 5.5  --  5.1  --   --   HGB 12.9  < > 12.0 12.3 13.4  HCT 39.2  < >  37.9 38.1 41.8  MCV 93.1  --  93.3 92.7 93.5  PLT 229  --  210 216 293  < > = values in this interval not displayed. INR: Recent Labs  09/26/14 1847  INR 1.19   Basic Metabolic Panel: Recent Labs  09/27/14 0343 09/29/14 0342  NA 139 140  K 4.3 3.2*  CL 102 93*  CO2 27 35*  GLUCOSE 145* 118*  BUN 19 38*  CREATININE 0.88 1.04*  CALCIUM 8.7* 9.0   Liver Function Tests: Recent Labs  09/26/14 1847 09/27/14 0343  AST 39 37  ALT 31 29  ALKPHOS 113 107  BILITOT 1.1 0.9  PROT 7.2 6.8  ALBUMIN 3.9 3.3*   Cardiac Enzymes: Recent Labs  09/26/14 2237 09/27/14 0343 09/27/14 0940  TROPONINI 0.47* 0.48* 0.36*    Recent Labs  09/26/14 1852  TROPIPOC 0.65*   BNP:  B NATRIURETIC PEPTIDE  Date/Time Value Ref Range Status  09/27/2014 09:40 AM 1915.4* 0.0 - 100.0 pg/mL Final   Hemoglobin A1C: Recent Labs  09/27/14 0343  HGBA1C 5.9*   Fasting Lipid Panel: Recent Labs  09/27/14 0343  CHOL 120  HDL 37*  LDLCALC 62  TRIG 161  CHOLHDL 3.2   Thyroid Function Tests: Recent Labs  09/27/14 0940  TSH 1.926   TELE:   Atrial flutter, occ PVCs, rate 40s at times overnight, ?while asleep     Echo: 09/27/2014 Study Conclusions - Left ventricle: The cavity size was normal. Systolic function was severely reduced. The estimated ejection fraction was in the range of 20% to 25%. There is akinesis of the mid-apical anteroseptal, anterior, anterolateral, lateral, inferior, and apical myocardium. Has Takotsubo configuration . Doppler parameters are consistent with a reversible restrictive pattern, indicative of decreased left ventricular diastolic compliance and/or increased left atrial pressure (grade 3 diastolic dysfunction). - Mitral valve: There was mild regurgitation. - Right ventricle: The cavity size was moderately dilated. Wall thickness was normal. Systolic function was moderately reduced. - Pulmonary arteries: Systolic pressure was mildly to  moderately increased. PA peak pressure: 48 mm Hg (S). Impressions: - Takotsubo appearance. Correlates with low level troponin and T wave inversion on ECG.  Radiology/Studies: Dg Chest Port 1 View 09/27/2014   CLINICAL DATA:  Dyspnea  EXAM: PORTABLE CHEST - 1 VIEW  COMPARISON:  09/26/2014  FINDINGS: Cardiomegaly again noted. Stable mild interstitial prominence bilateral without convincing pulmonary edema. Slight worsening bilateral basilar hazy atelectasis or infiltrate right greater than left. Question small right pleural effusion.  IMPRESSION: Stable mild interstitial prominence bilateral without convincing pulmonary edema. Slight worsening bilateral basilar hazy atelectasis or infiltrate right greater than left. Question small right pleural effusion.   Electronically Signed   By: Natasha Mead M.D.   On: 09/27/2014 08:23   Dg Swallowing Func-speech Pathology 09/28/2014    Objective Swallowing Evaluation:   Modified Barium Swallow Patient Details  CHL IP CLINICAL IMPRESSIONS 09/28/2014  Therapy Diagnosis Mild pharyngeal phase dysphagia;Mild oral phase  dysphagia  Clinical Impression Pt. demonstrated mild oropharyngeal dysphagia with  decreased oral cohesion and ability to fully clear oral cavity resulting  in post swallow spill to valleculae and pyriform sinuses. Intermittent  penetration with thin (flash x 1 and deep) resulting in strong cough  reflex. Penetration not observed with therapeutic strategy using chin  tuck. Minimal sporadic vallecular residue due to mildly decreased tongue  based retraction. SLP recommends Dys 2, thin liquids, chin tuck with  liquids, pills whole in applesauce, full supervision for strategy and  continue ST intervention and education.      CHL IP TREATMENT RECOMMENDATION 09/28/2014  Treatment Recommendations Therapy as outlined in treatment plan below     CHL IP DIET RECOMMENDATION 09/28/2014  SLP Diet Recommendations Dysphagia 2 (Fine chop);Thin  Liquid Administration via  (None)  Medication Administration Whole meds with puree  Compensations Slow rate;Small sips/bites;Chin tuck  Postural Changes and/or Swallow Maneuvers (None)     CHL IP OTHER RECOMMENDATIONS 09/28/2014  Recommended Consults (None)  Oral Care Recommendations Oral care BID  Other Recommendations (None)    CHL IP FOLLOW UP RECOMMENDATIONS 09/28/2014  Follow up Recommendations Inpatient Rehab     CHL IP FREQUENCY AND DURATION 09/28/2014  Speech Therapy Frequency (ACUTE ONLY) min 2x/week  Treatment Duration 2 weeks     Pertinent Vitals/Pain none    SLP Swallow Goals No flowsheet data found.  No flowsheet data found.      Royce Macadamia 09/28/2014, 12:19 PM  Breck Coons Lonell Face.Ed CCC-SLP Pager [phone]       Current Medications:  . antiseptic oral rinse  7 mL Mouth Rinse q12n4p  . aspirin  300 mg Rectal Daily   Or  . aspirin  325 mg Oral Daily  . chlorhexidine  15 mL Mouth Rinse BID  . furosemide  40 mg Intravenous BID  . insulin aspart  0-9 Units Subcutaneous 6 times per day  . ipratropium-albuterol  3 mL Nebulization TID  . potassium chloride  10 mEq Oral TID   . heparin 1,200 Units/hr (09/27/14 1851)    ASSESSMENT AND PLAN: Principal Problem:   Stroke - per IM/Neuro   Elevated troponin - likely 2nd Takotsubo event - no clear anginal sx - ez have been flat  - echo w/ Takotsubo appearance   Acute systolic CHF (congestive heart failure) w/ Acute respiratory Failure - Improved w/ Lasix IV, no longer on BiPAP - good UOP w/ Lasix 40 mg IV BID but BUN/Cr increasing - possible change to PO Lasix with PM dose - MD advise on changing Lasix to 40 mg po bid, starting this pm - continue to follow RF - HR too low at times to add BB - With BUN/Cr elevated, no ACE/ARB for now    Hypokalemia - 2nd diuresis - IM ordered 10 meq tid - will order 20 meq x 1, otherwise, per IM    Atrial flutter/fib - currently in flutter, unknown duration - rate OK, not on any rate-lowering meds  Other  problems, per IM Active Problems:   Hypertension   COPD exacerbation   Generalized anxiety disorder   Essential hypertension    Signed, Barrett, Bjorn Loser , PA-C 7:52 AM 09/29/2014 Agree with note by Theodore Demark PA-C  Sats 100% on 2L. Agree with transitioning to PO. Pt is alert and oriented. S/P CVA not Rx with rTPA. On IV hep. CT neg. Neuro/Stroke team following. Low grade trop leak, Severe LV dysfunction with Takasubo configuration. Suspect that this will improve over time. Aflutter with CVR. Doubt that she will be an OAC candidate. Will continue to follow.   Runell Gess, M.D., FACP, [facility], [facility] [address] [place], [place]  [address]  [phone] 09/29/2014 8:40 AM

## 2014-09-29 NOTE — Progress Notes (Addendum)
ANTICOAGULATION CONSULT NOTE - Follow Up Consult  Pharmacy Consult for Heparin > Apixaban Indication: Aflutter  Allergies  Allergen Reactions  . Penicillins     Patient Measurements: Height: 5' 3.5" (161.3 cm) Weight: 179 lb 10.8 oz (81.5 kg) IBW/kg (Calculated) : 53.55 Heparin Dosing Weight: 72kg  Vital Signs: Temp: 98.4 F (36.9 C) (06/16 0809) Temp Source: Axillary (06/16 0809) BP: 141/61 mmHg (06/16 0809) Pulse Rate: 49 (06/16 0809)  Labs:  Recent Labs  09/26/14 1847 09/26/14 1854 09/26/14 2237  09/27/14 0343 09/27/14 0940 09/27/14 1515 09/28/14 0255 09/29/14 0342  HGB 12.9 14.6  --   --  12.0  --   --  12.3 13.4  HCT 39.2 43.0  --   --  37.9  --   --  38.1 41.8  PLT 229  --   --   --  210  --   --  216 293  APTT 30  --   --   --   --   --   --   --   --   LABPROT 15.2  --   --   --   --   --   --   --   --   INR 1.19  --   --   --   --   --   --   --   --   HEPARINUNFRC  --   --   --   < > 0.19*  --  0.30 0.45 0.35  CREATININE 1.10* 1.00  --   --  0.88  --   --   --  1.04*  TROPONINI  --   --  0.47*  --  0.48* 0.36*  --   --   --   < > = values in this interval not displayed.  Estimated Creatinine Clearance: 40.5 mL/min (by C-G formula based on Cr of 1.04).   Medications:  Heparin @ 1200 units/hr  Assessment: 87yof admitted as a code stroke on 6/13 - CT head negative. Also found to have elevated troponin, EKG changes, and new aflutter. She was started on IV heparin. Heparin level is therapeutic (aiming for a lower goal in setting of stroke). CBC is stable. No bleeding reported.  AM HL remains therapeutic at 0.35 on heparin 1200 units/hr. No issues with infusion or bleeding noted.  Goal of Therapy:  Heparin level Heparin level 0.3-0.5 units/ml Monitor platelets by anticoagulation protocol: Yes   Plan:  Continue heparin at 1200 units/hr Heparin level and CBC in AM  Arlean Hopping. Newman Pies, PharmD Clinical Pharmacist Pager [phone] 09/29/2014,8:28  AM  ADDN: Pharmacy is consulted to transition heparin to apixaban.   Plan: Apixaban 5mg  BID Daily CBC Monitor s/sx of bleeding Educate pt on apixaban  Arlean Hopping. Newman Pies, PharmD Clinical Pharmacist Pager [phone]

## 2014-09-29 NOTE — Progress Notes (Signed)
Physical Therapy Treatment Patient Details Name: Allison Macdonald MRN: [id number] DOB: 1926-11-04 Today's Date: 09/29/2014    History of Present Illness 81 y.o. female with history of hypertension, COPD, HTN, anxiety and chronic back pain was brought to the ER after patient was found to be having increasing confusion and left facial droop. CT no acute abnormality. Chronic microvascular ischemic change. Per chart patient also had a fall 2 days ago which patient states she tripped and fell and has a small left peri-orbital hematoma. Pt unable to tolerate MRI. Respiratory distress 6/14 with suspected aspiration, put on Bipap. CXR stable mild interstitial prominence bilateral without convincing pulmonary edema. Slight worsening bilateral basilar hazy atelectasis or infiltrate right greater than left. Question small right pleural effusion.     PT Comments    Pt pleasant and maintaining right gaze preference but when cued would scan for items to her left today. Pt with limited neck ROM particularly extension and performed modified Gilberto Better with pt report of nausea but no nystagmus or spinning and same right and left. Pt educated for gait, balance and attention deficits who continues to need therapy and supervision to maximize function and safety. Sats 88-91% on RA with gait, 97% on 1L at rest.   Follow Up Recommendations  CIR     Equipment Recommendations       Recommendations for Other Services       Precautions / Restrictions Precautions Precautions: Fall Precaution Comments: L sided inattention Restrictions Weight Bearing Restrictions: No    Mobility  Bed Mobility Overal bed mobility: Needs Assistance Bed Mobility: Supine to Sit;Sit to Supine     Supine to sit: Min guard Sit to supine: Min assist   General bed mobility comments: cues for sequence with difficulty bringing legs onto surface. Assist into long sitting position.  Transfers     Transfers: Sit to/from Stand Sit  to Stand: Min guard         General transfer comment: guarding for safety and lines with cues for hand placement  Ambulation/Gait Ambulation/Gait assistance: Min guard;Min assist Ambulation Distance (Feet): 180 Feet Assistive device: Rolling walker (2 wheeled);None Gait Pattern/deviations: Step-through pattern;Decreased stride length;Narrow base of support;Drifts right/left Gait velocity: Decreased   General Gait Details: with RW cues for scanning envirionment, head up, awareness of environment and avoidance of obstacles as pt close to right wall at times but hitting things on left. Without Rw for 25' pt with scissoring x 1 and posterior LOB with assist to prevent fall.    Stairs            Wheelchair Mobility    Modified Rankin (Stroke Patients Only) Modified Rankin (Stroke Patients Only) Pre-Morbid Rankin Score: No symptoms Modified Rankin: Moderately severe disability     Balance Overall balance assessment: Needs assistance   Sitting balance-Leahy Scale: Good       Standing balance-Leahy Scale: Fair   Single Leg Stance - Right Leg: 5 Single Leg Stance - Left Leg: 4     Rhomberg - Eyes Opened: 20 Rhomberg - Eyes Closed: 4   High Level Balance Comments: pt with slow cautious gait with turning 360degrees toward left and guarding for safety    Cognition Arousal/Alertness: Awake/alert Behavior During Therapy: Flat affect Overall Cognitive Status: Impaired/Different from baseline Area of Impairment: Problem solving;Safety/judgement         Safety/Judgement: Decreased awareness of safety;Decreased awareness of deficits   Problem Solving: Requires verbal cues;Requires tactile cues;Difficulty sequencing General Comments: running into items on left,  tightly turning against wall on right as unaware of space to her left, pulling on left UE IV unaware of its use    Exercises      General Comments General comments (skin integrity, edema, etc.): pt able to  track left with visual tracking but tendency to lose target particularly in left lower field. Pt with reports of nausea on her back with right or left head turn but no spinning or nystagmus      Pertinent Vitals/Pain Pain Assessment: 0-10 Pain Score: 8  Pain Location: generalized HA Pain Descriptors / Indicators: Aching Pain Intervention(s): Repositioned;Patient requesting pain meds-RN notified    Home Living                      Prior Function            PT Goals (current goals can now be found in the care plan section) Progress towards PT goals: Progressing toward goals    Frequency       PT Plan Current plan remains appropriate    Co-evaluation             End of Session Equipment Utilized During Treatment: Gait belt Activity Tolerance: Patient tolerated treatment well Patient left: in chair;with chair alarm set;with call bell/phone within reach     Time: 1036-1106 PT Time Calculation (min) (ACUTE ONLY): 30 min  Charges:  $Gait Training: 8-22 mins $Neuromuscular Re-education: 8-22 mins                    G Codes:      Delorse Lek 10/13/14, 11:20 AM Delaney Meigs, PT [phone]

## 2014-09-29 NOTE — Progress Notes (Signed)
Utilization Review Completed.  

## 2014-09-29 NOTE — Progress Notes (Signed)
STROKE TEAM PROGRESS NOTE   HISTORY Allison Macdonald is a 81 y.o. female with a history of htn who was seen to have left facial droop at 5:10 pm 09/26/2014 ([facility]KW). It was unclear exactly when she was last known to be normal as the patient was not aware of her deficits. She was brought in as a code stroke and CT was negative. She has mild left sided weakness, but was not a candidate for tpa both due to mild deficits, but alos unclear time of onset. Patient was not administered TPA secondary to delay in arrival. She was admitted for further evaluation and treatment.   SUBJECTIVE (INTERVAL HISTORY) Her RN is at the bedside.  Overall she feels her condition is improved today . She is on IV heparin drip for atrial flutter. She refuses CT angiogram as well as MRI  OBJECTIVE Temp:  [97.6 F (36.4 C)-98.9 F (37.2 C)] 98.4 F (36.9 C) (06/16 0809) Pulse Rate:  [49-81] 70 (06/16 1122) Cardiac Rhythm:  [-] Atrial flutter (06/16 0745) Resp:  [15-22] 17 (06/16 1122) BP: (91-149)/(61-74) 130/64 mmHg (06/16 1000) SpO2:  [90 %-100 %] 90 % (06/16 1122) Weight:  [179 lb 10.8 oz (81.5 kg)] 179 lb 10.8 oz (81.5 kg) (06/16 0350)   Recent Labs Lab 09/28/14 1624 09/28/14 2105 09/29/14 0028 09/29/14 0410 09/29/14 0811  GLUCAP 131* 116* 119* 107* 102*    Recent Labs Lab 09/26/14 1847 09/26/14 1854 09/27/14 0343 09/29/14 0342  NA 138 140 139 140  K 4.1 4.0 4.3 3.2*  CL 103 102 102 93*  CO2 23  --  27 35*  GLUCOSE 137* 142* 145* 118*  BUN 20 22* 19 38*  CREATININE 1.10* 1.00 0.88 1.04*  CALCIUM 9.1  --  8.7* 9.0  MG  --   --   --  1.7    Recent Labs Lab 09/26/14 1847 09/27/14 0343  AST 39 37  ALT 31 29  ALKPHOS 113 107  BILITOT 1.1 0.9  PROT 7.2 6.8  ALBUMIN 3.9 3.3*    Recent Labs Lab 09/26/14 1847 09/26/14 1854 09/27/14 0343 09/28/14 0255 09/29/14 0342  WBC 10.5  --  8.0 6.3 12.2*  NEUTROABS 5.5  --  5.1  --   --   HGB 12.9 14.6 12.0 12.3 13.4  HCT 39.2 43.0 37.9 38.1 41.8   MCV 93.1  --  93.3 92.7 93.5  PLT 229  --  210 216 293    Recent Labs Lab 09/26/14 2237 09/27/14 0343 09/27/14 0940  TROPONINI 0.47* 0.48* 0.36*    Recent Labs  09/26/14 1847  LABPROT 15.2  INR 1.19    Recent Labs  09/26/14 2021  COLORURINE ORANGE*  LABSPEC 1.033*  PHURINE 5.0  GLUCOSEU NEGATIVE  HGBUR TRACE*  BILIRUBINUR SMALL*  KETONESUR 15*  PROTEINUR 100*  UROBILINOGEN 1.0  NITRITE NEGATIVE  LEUKOCYTESUR NEGATIVE       Component Value Date/Time   CHOL 120 09/27/2014 0343   TRIG 103 09/27/2014 0343   HDL 37* 09/27/2014 0343   CHOLHDL 3.2 09/27/2014 0343   VLDL 21 09/27/2014 0343   LDLCALC 62 09/27/2014 0343   Lab Results  Component Value Date   HGBA1C 5.9* 09/27/2014      Component Value Date/Time   LABOPIA POSITIVE* 09/26/2014 2021   COCAINSCRNUR NONE DETECTED 09/26/2014 2021   LABBENZ POSITIVE* 09/26/2014 2021   AMPHETMU NONE DETECTED 09/26/2014 2021   THCU NONE DETECTED 09/26/2014 2021   LABBARB NONE DETECTED 09/26/2014 2021  Recent Labs Lab 09/26/14 2237  [facility] <5    Dg Swallowing Func-speech Pathology  09/28/2014    Objective Swallowing Evaluation:   Modified Barium Swallow Patient Details  Name: Allison Macdonald MRN: [id number] Date of Birth: Dec 08, 1926  Today's Date: 09/28/2014 Time: SLP Start Time (ACUTE ONLY): 1125-SLP Stop Time (ACUTE ONLY): 1145 SLP Time Calculation (min) (ACUTE ONLY): 20 min  Past Medical History:  Past Medical History  Diagnosis Date  . Anxiety   . Hypertension    Past Surgical History:  Past Surgical History  Procedure Laterality Date  . Back surgery    . Abdominal hysterectomy     HPI:  Other Pertinent Information: 81 y.o. female with history of hypertension,   COPD, HTN, anxiety  and chronic back pain was brought to the ER after  patient was found to be having increasing confusion and left facial droop.  CT no acute abnormality. Chronic microvascular ischemic change. Per chart  patient also had a fall 2 days ago which  patient states she tripped and  fell and has a small left peri-orbital hematoma. Pt unable to tolerate  MRI. Respiratory distress 6/14 with suspected aspiration, put on Bipap.  CXR stable mild interstitial prominence bilateral without convincing  pulmonary edema. Slight worsening bilateral basilar hazy atelectasis or  infiltrate right greater than left. Question small right pleural effusion.  Indications aspiration during bedside.  No Data Recorded  Assessment / Plan / Recommendation CHL IP CLINICAL IMPRESSIONS 09/28/2014  Therapy Diagnosis Mild pharyngeal phase dysphagia;Mild oral phase  dysphagia  Clinical Impression Pt. demonstrated mild oropharyngeal dysphagia with  decreased oral cohesion and ability to fully clear oral cavity resulting  in post swallow spill to valleculae and pyriform sinuses. Intermittent  penetration with thin (flash x 1 and deep) resulting in strong cough  reflex. Penetration not observed with therapeutic strategy using chin  tuck. Minimal sporadic vallecular residue due to mildly decreased tongue  based retraction. SLP recommends Dys 2, thin liquids, chin tuck with  liquids, pills whole in applesauce, full supervision for strategy and  continue ST intervention and education.      CHL IP TREATMENT RECOMMENDATION 09/28/2014  Treatment Recommendations Therapy as outlined in treatment plan below     CHL IP DIET RECOMMENDATION 09/28/2014  SLP Diet Recommendations Dysphagia 2 (Fine chop);Thin  Liquid Administration via (None)  Medication Administration Whole meds with puree  Compensations Slow rate;Small sips/bites;Chin tuck  Postural Changes and/or Swallow Maneuvers (None)     CHL IP OTHER RECOMMENDATIONS 09/28/2014  Recommended Consults (None)  Oral Care Recommendations Oral care BID  Other Recommendations (None)    CHL IP FOLLOW UP RECOMMENDATIONS 09/28/2014  Follow up Recommendations Inpatient Rehab     CHL IP FREQUENCY AND DURATION 09/28/2014  Speech Therapy Frequency (ACUTE ONLY) min 2x/week   Treatment Duration 2 weeks     Pertinent Vitals/Pain none    SLP Swallow Goals No flowsheet data found.  No flowsheet data found.    CHL IP REASON FOR REFERRAL 09/28/2014  Reason for Referral Objectively evaluate swallowing function     CHL IP ORAL PHASE 09/28/2014  Lips (None)  Tongue (None)  Mucous membranes (None)  Nutritional status (None)  Other (None)  Oxygen therapy (None)  Oral Phase Impaired  Oral - Pudding Teaspoon (None)  Oral - Pudding Cup (None)  Oral - Honey Teaspoon (None)  Oral - Honey Cup (None)  Oral - Honey Syringe (None)  Oral - Nectar Teaspoon (None)  Oral - Nectar Cup (None)  Oral - Nectar Straw (None)  Oral - Nectar Syringe (None)  Oral - Ice Chips (None)  Oral - Thin Teaspoon (None)  Oral - Thin Cup (None)  Oral - Thin Straw (None)  Oral - Thin Syringe (None)  Oral - Puree (None)  Oral - Mechanical Soft (None)  Oral - Regular (None)  Oral - Multi-consistency (None)  Oral - Pill (None)  Oral Phase - Comment (None)      CHL IP PHARYNGEAL PHASE 09/28/2014  Pharyngeal Phase Impaired  Pharyngeal - Pudding Teaspoon (None)  Penetration/Aspiration details (pudding teaspoon) (None)  Pharyngeal - Pudding Cup (None)  Penetration/Aspiration details (pudding cup) (None)  Pharyngeal - Honey Teaspoon (None)  Penetration/Aspiration details (honey teaspoon) (None)  Pharyngeal - Honey Cup (None)  Penetration/Aspiration details (honey cup) (None)  Pharyngeal - Honey Syringe (None)  Penetration/Aspiration details (honey syringe) (None)  Pharyngeal - Nectar Teaspoon (None)  Penetration/Aspiration details (nectar teaspoon) (None)  Pharyngeal - Nectar Cup (None)  Penetration/Aspiration details (nectar cup) (None)  Pharyngeal - Nectar Straw (None)  Penetration/Aspiration details (nectar straw) (None)  Pharyngeal - Nectar Syringe (None)  Penetration/Aspiration details (nectar syringe) (None)  Pharyngeal - Ice Chips (None)  Penetration/Aspiration details (ice chips) (None)  Pharyngeal - Thin Teaspoon (None)   Penetration/Aspiration details (thin teaspoon) (None)  Pharyngeal - Thin Cup (None)  Penetration/Aspiration details (thin cup) (None)  Pharyngeal - Thin Straw (None)  Penetration/Aspiration details (thin straw) (None)  Pharyngeal - Thin Syringe (None)  Penetration/Aspiration details (thin syringe') (None)  Pharyngeal - Puree (None)  Penetration/Aspiration details (puree) (None)  Pharyngeal - Mechanical Soft (None)  Penetration/Aspiration details (mechanical soft) (None)  Pharyngeal - Regular (None)  Penetration/Aspiration details (regular) (None)  Pharyngeal - Multi-consistency (None)  Penetration/Aspiration details (multi-consistency) (None)  Pharyngeal - Pill (None)  Penetration/Aspiration details (pill) (None)  Pharyngeal Comment (None)      CHL IP CERVICAL ESOPHAGEAL PHASE 09/28/2014  Cervical Esophageal Phase WFL  Pudding Teaspoon (None)  Pudding Cup (None)  Honey Teaspoon (None)  Honey Cup (None)  Honey Straw (None)  Nectar Teaspoon (None)  Nectar Cup (None)  Nectar Straw (None)  Nectar Sippy Cup (None)  Thin Teaspoon (None)  Thin Cup (None)  Thin Straw (None)  Thin Sippy Cup (None)  Cervical Esophageal Comment (None)    No flowsheet data found.         Royce Macadamia 09/28/2014, 12:19 PM  Breck Coons Lonell Face.Ed CCC-SLP Pager [phone]     2D Echocardiogram   - Left ventricle: The cavity size was normal. Systolic function wasseverely reduced. The estimated ejection fraction was in therange of 20% to 25%. There is akinesis of the mid-apicalanteroseptal, anterior, anterolateral, lateral, inferior, andapical myocardium. Has Takotsubo configuration . Dopplerparameters are consistent with a reversible restrictive pattern,indicative of decreased left ventricular diastolic complianceand/or increased left atrial pressure (grade 3 diastolicdysfunction). - Mitral valve: There was mild regurgitation. - Right ventricle: The cavity size was moderately dilated. Wallthickness was normal. Systolic function  was moderately reduced. - Pulmonary arteries: Systolic pressure was mildly to moderatelyincreased. PA peak pressure: 48 mm Hg (S). Impressions: Takotsubo appearance. Correlates with low level troponin and Twave inversion on ECG.  Carotid Doppler  Carotid Duplex Completed. Mild plaque visualized in both ICA with no evidence of stenosis noted.    PHYSICAL EXAM Frail elderly Caucasian lady in respiratory distress on BiPAP. Marland Kitchen Afebrile. Head is nontraumatic. Neck is supple without bruit.    Cardiac exam no murmur or gallop. Lungs are clear to auscultation. Distal pulses are well felt.  Neurological Exam : Awake alert oriented 3. Speech is clear. No aphasia. Extraocular moments are full range without nystagmus. Fundi could not be visualized. Vision acuity  seems diminished in left eye ( chronic per patient). Face symmetric. Tongue midline. Motor system exam mild left upper and lower extremity drift. Weakness of left grip and intrinsic hand muscles. Weakness of left hip flexor and ankle dorsiflexors 4/5. Left plantar is equivocal right downgoing. Touch and pinprick sensation are preserved bilaterally. Coordination is slow but accurate on the left and normal on the right. Gait was not tested. ASSESSMENT/PLAN Ms. Allison Macdonald is a 81 y.o. female with history of hypertension, COPD and chronic back pain  presenting with left sided weakness and confusion. She did not receive IV t-PA due to being out of the window.   Possible right brain small infarct not visualized on CT and patient refused MRI, etiology cardioembolic from atrial flutter  Resultant  Neuro symptoms improved  MRI  Patient refused  MRA  refused  Carotid Doppler  No significant stenosis  2D Echo  EF 20-25% with Takotsubo appearance. No embolus seen  LDL 62 at goal < 70  HgbA1c pending  IV heparin for VTE prophylaxis DIET DYS 2 Room service appropriate?: Yes; Fluid consistency:: Nectar Thick; Fluid restriction:: Other (see  comments)  no antithrombotic prior to admission, now on heparin and aspirin 300 mg suppository daily. No indication for both agents from the stroke standpoint.  Ongoing aggressive stroke risk factor management Therapy recommendations:  CLRDisposition:  CLR Atrial flutter  Not on anticoagulation prior to admission  Currently on IV heparin  Elevated troponin may be related  Acute chest pain  Currently on IV heparin  Troponin mildly elevated  Essential Hypertension  Home meds:   lisinopril  BP 121-193/65-93 past 24h (09/29/2014 @ 11:53 AM)   Other Stroke Risk Factors  Advanced age  Obesity, Body mass index is 31.32 kg/(m^2).   Family hx stroke ()  Other Active Problems  COPD exacerbation  Respiratory distress, felt to be volume overloaded. Placed on Lasix. BiPap.  Generalized anxiety disorder, on Ativan twice a day prior to admission  Hospital day # 3  Allison Macdonald  [facility] See Amion for Pager information 09/29/2014 11:53 AM  Recommend start Eliquis for anticoagulation and Dc heparin drip Mobilize out of bed. CLR transfer next few days. Stroke team will sign off. Call for questions. D/W Dr David Stall, MD Medical Director [facility] Pager: [phone] 09/29/2014 11:53 AM  To contact Stroke Continuity provider, please refer to [email or web address]. After hours, contact General Neurology

## 2014-09-29 NOTE — Progress Notes (Signed)
Initial Nutrition Assessment  DOCUMENTATION CODES:  Obesity unspecified  INTERVENTION:  Magic cup TID with meals, each supplement provides 290 kcal and 9 grams of protein  NUTRITION DIAGNOSIS:  Inadequate oral intake related to dysphagia as evidenced by meal completion < 50%  GOAL:  Patient will meet greater than or equal to 90% of their needs  MONITOR:  PO intake, Supplement acceptance, Labs, Weight trends, I & O's  REASON FOR ASSESSMENT:  Malnutrition Screening Tool  ASSESSMENT: 81 y.o. female with history of HTN and COPD and chronic back pain was brought to the ER after patient was found to be having increasing confusion and left facial droop.   Patient reports a good appetite.  PO intake poor at 0-45% per flowsheet records.  Drinks Boost oral nutrition supplements at home.  Per Malnutrition Screening Tool Report, pt has had recent weight loss, however, patient unable confirm.  RD to order Magic Cup dessert supplement on meal trays.  Nutrition focused physical exam completed.  No muscle or subcutaneous fat depletion noticed.  Height:  Ht Readings from Last 1 Encounters:  09/29/14 5' 3.5" (1.613 m)    Weight:  Wt Readings from Last 1 Encounters:  09/29/14 179 lb 10.8 oz (81.5 kg)    Ideal Body Weight:  52.2 kg  Wt Readings from Last 10 Encounters:  09/29/14 179 lb 10.8 oz (81.5 kg)    BMI:  Body mass index is 31.32 kg/(m^2).  Estimated Nutritional Needs:  Kcal:  1700-1900  Protein:  80-90 gm  Fluid:  1.7-1.9 L  Skin:  Reviewed, no issues  Diet Order:  DIET DYS 2 Room service appropriate?: Yes; Fluid consistency:: Nectar Thick; Fluid restriction:: Other (see comments)  EDUCATION NEEDS:  No education needs identified at this time   Intake/Output Summary (Last 24 hours) at 09/29/14 1209 Last data filed at 09/29/14 1146  Gross per 24 hour  Intake 661.53 ml  Output   3925 ml  Net -3263.47 ml    Last BM:  Unknown   Maureen Chatters, RD,  LDN Pager #: [phone] After-Hours Pager #: [phone]

## 2014-09-29 NOTE — Progress Notes (Signed)
Speech Language Pathology Treatment: Dysphagia  Patient Details Name: Allison Macdonald MRN: [id number] DOB: 1927/01/27 Today's Date: 09/29/2014 Time: 6010-9323 SLP Time Calculation (min) (ACUTE ONLY): 17 min  Assessment / Plan / Recommendation Clinical Impression  RN reported that pt coughing last night with liquids. Suspect she took larger sip or forgot to tuck chin. Liquids thickened to nectar consistency without s/s aspiration. Mod-max verbal and visual cueing for awareness of egg residue covering majority of tongue and use of tongue sweep or finger to remove which she was successful with. She continues to require full supervision. Liquids downgraded to nectar thick and continue Dys 2 and ST intervention.   HPI Other Pertinent Information: 81 y.o. female with history of hypertension,  COPD, HTN, anxiety  and chronic back pain was brought to the ER after patient was found to be having increasing confusion and left facial droop. CT no acute abnormality. Chronic microvascular ischemic change. Per chart patient also had a fall 2 days ago which patient states she tripped and fell and has a small left peri-orbital hematoma. Pt unable to tolerate MRI. Respiratory distress 6/14 with suspected aspiration, put on Bipap. CXR stable mild interstitial prominence bilateral without convincing pulmonary edema. Slight worsening bilateral basilar hazy atelectasis or infiltrate right greater than left. Question small right pleural effusion. Indications aspiration during bedside.   Pertinent Vitals Pain Assessment: No/denies pain (headache)  SLP Plan  Continue with current plan of care    Recommendations Diet recommendations: Nectar-thick liquid;Dysphagia 2 (fine chop) Liquids provided via: Cup;No straw Medication Administration: Whole meds with puree Supervision: Patient able to self feed;Full supervision/cueing for compensatory strategies Compensations: Slow rate;Small sips/bites;Check for pocketing Postural  Changes and/or Swallow Maneuvers: Seated upright 90 degrees              Oral Care Recommendations: Oral care BID Follow up Recommendations: Inpatient Rehab Plan: Continue with current plan of care    GO     Royce Macadamia 09/29/2014, 9:22 AM  Breck Coons Lonell Face.Ed [employer] [phone]

## 2014-09-29 NOTE — Care Management Note (Signed)
Case Management Note  Patient Details  Name: Allison Macdonald MRN: [id number] Date of Birth: 04/20/1926  Subjective/Objective:       Pt lives with two sons, they are available 24/7 for assistance as needed. Pt to discharge home with home health services, will need home health nursing and physical therapy. Provided list of home health agencies and referral made to [facility] as requested.                               Expected Discharge Plan:  Home w Home Health Services  In-House Referral:     Discharge planning Services  CM Consult  Post Acute Care Choice:  Home Health Choice offered to:     DME Arranged:    DME Agency:     HH Arranged:  RN, PT HH Agency:  [facility]  Status of Service:     Medicare Important Message Given:    Date Medicare IM Given:    Medicare IM give by:    Date Additional Medicare IM Given:    Additional Medicare Important Message give by:     If discussed at Long Length of Stay Meetings, dates discussed:    Additional Comments:  Magdalene River, RN 09/29/2014, 3:19 PM

## 2014-09-29 NOTE — Progress Notes (Addendum)
[facility] PROGRESS NOTE  [name] [name] DOB: [birth year] DOA: 09/26/2014 PCP: No primary care provider on file.  Summary 81 yo female with h/o HTN COPD and anxiety presented with left facial droop and confusion. CT brain negative for anything acute. Noted to be in atrial flutter which is a new problem for her. Admitted to stroke floor. Subsequently developed acute respiratory failure secondary to pulmonary edema and possibly COPD component, requiring BiPAP and transferred to stepdown. CODE STATUS is DO NOT INTUBATE. Echocardiogram shows ejection fraction of 20-25% and Takatsubo appearance. Breathing much improved with Lasix and bronchodilators. Refuses MRI. Refused CTA brain yesterday as well  Assessment/Plan:  Principal Problem:   Stroke:  Refused MRI and CT angiogram of the head. Still with left facial droop, but no focal weakness otherwise. Started on dysphagia 2 diet with thin liquids yesterday but started choking on thins so has been reevaluated and is now on thickened liquids. Therapies are recommending inpatient rehabilitation. I have consulted them. Patient is equivocal about going but she is not safe for discharge back home once medically stable. I spoke with patient's son and he agreed. He will talk to her today about a safe disposition. Currently on heparin drip. D/w Dr. Pearlean Brownie. Change to eliquis Echo Left ventricle: The cavity size was normal. Systolic function was severely reduced. The estimated ejection fraction was in the range of 20% to 25%. There is akinesis of the mid-apical anteroseptal, anterior, anterolateral, lateral, inferior, and apical myocardium. Has Takotsubo configuration . Doppler parameters are consistent with a reversible restrictive pattern, indicative of decreased left ventricular diastolic compliance and/or increased left atrial pressure (grade 3 diastolic dysfunction). - Mitral valve: There was mild regurgitation. -  Right ventricle: The cavity size was moderately dilated. Wall thickness was normal. Systolic function was moderately reduced. - Pulmonary arteries: Systolic pressure was mildly to moderately increased. PA peak pressure: 48 mm Hg (S). Impressions: - Takotsubo appearance. Correlates with low level troponin and T wave inversion on ECG.  Carotid Dopplers Preliminary results by tech - Carotid Duplex Completed. Mild plaque visualized in both ICA with no evidence of stenosis noted.   LDL 62, hemoglobin A1c 5.7  Active Problems:    Acute respiratory failure:  Secondary to acute pulmonary edema with COPD and possible aspiration component. Much better. Continue to wean oxygen. Transfer back to neuro unit out of the stepdown unit. Lasix per cardiology.  Cardiomyopathy, Takatsubo.  Hopefully, will improve with time. Will need repeat echocardiogram as an outpatient. No ischemia workup at this time.  Heart rate too low at times for beta blocker. Increased BUN and creatinine precludes ACE inhibitor at this time per cardiology note.    COPD exacerbation: Much improved.  Dysphagia: See above    Elevated troponin: trend flat. EKG is abnormal with fairly diffuse T-wave inversions, consistent with Takatsubo cM    Atrial flutter:  No known previous history of same. On heparin drip. TSH normal. Await anticoagulation recommendations from cardiology and neurology.    Generalized anxiety disorder:  Continue Ativan as needed.    Essential hypertension:  meds held  Hypokalemia: Replete.  Code Status:  limited: DO NOT INTUBATE  Family Communication:  Son by phone 6/16 Disposition Plan:  Hopefully, CIR if appropriate and patient agrees.  Consults Neurology Cardiology PM&R  Procedures:     Antibiotics:    HPI/Subjective: No complaints. Denies choking on thin liquids despite multiple reports from nursing staff.   Objective: Filed Vitals:   09/29/14 0809  BP: 141/61  Pulse: 49  Temp:  98.4 F (36.9 C)  Resp: 21    Intake/Output Summary (Last 24 hours) at 09/29/14 1008 Last data filed at 09/29/14 0930  Gross per 24 hour  Intake 361.53 ml  Output   3375 ml  Net -3013.47 ml   Filed Weights   09/27/14 1033 09/28/14 0330 09/29/14 0350  Weight: 85.9 kg (189 lb 6 oz) 83.5 kg (184 lb 1.4 oz) 81.5 kg (179 lb 10.8 oz)   Telemetry: atrial flutter rate 75  Exam:   General:   In chair. Breathing nonlabored. Cooperative.  Cardiovascular:  regular rate rhythm without murmurs gallops rubs   Respiratory:  Clear to auscultation bilaterally without wheeze rhonchi or rales  Abdomen:  soft nontender nondistended  Ext:  no edema  Neurologic: Cranial nerves: Left facial droop present.  Motor strength 5 out of 5 in all extremities.  Basic Metabolic Panel:  Recent Labs Lab 09/26/14 1847 09/26/14 1854 09/27/14 0343 09/29/14 0342  NA 138 140 139 140  K 4.1 4.0 4.3 3.2*  CL 103 102 102 93*  CO2 23  --  27 35*  GLUCOSE 137* 142* 145* 118*  BUN 20 22* 19 38*  CREATININE 1.10* 1.00 0.88 1.04*  CALCIUM 9.1  --  8.7* 9.0  MG  --   --   --  1.7   Liver Function Tests:  Recent Labs Lab 09/26/14 1847 09/27/14 0343  AST 39 37  ALT 31 29  ALKPHOS 113 107  BILITOT 1.1 0.9  PROT 7.2 6.8  ALBUMIN 3.9 3.3*   No results for input(s): LIPASE, AMYLASE in the last 168 hours. No results for input(s): AMMONIA in the last 168 hours. CBC:  Recent Labs Lab 09/26/14 1847 09/26/14 1854 09/27/14 0343 09/28/14 0255 09/29/14 0342  WBC 10.5  --  8.0 6.3 12.2*  NEUTROABS 5.5  --  5.1  --   --   HGB 12.9 14.6 12.0 12.3 13.4  HCT 39.2 43.0 37.9 38.1 41.8  MCV 93.1  --  93.3 92.7 93.5  PLT 229  --  210 216 293   Cardiac Enzymes:  Recent Labs Lab 09/26/14 2237 09/27/14 0343 09/27/14 0940  TROPONINI 0.47* 0.48* 0.36*   BNP (last 3 results)  Recent Labs  09/27/14 0940  BNP 1915.4*    ProBNP (last 3 results) No results for input(s): PROBNP in the last 8760  hours.  CBG:  Recent Labs Lab 09/28/14 1624 09/28/14 2105 09/29/14 0028 09/29/14 0410 09/29/14 0811  GLUCAP 131* 116* 119* 107* 102*    Recent Results (from the past 240 hour(s))  MRSA PCR Screening     Status: None   Collection Time: 09/28/14  9:58 PM  Result Value Ref Range Status   MRSA by PCR NEGATIVE NEGATIVE Final    Comment:        The GeneXpert MRSA Assay (FDA approved for NASAL specimens only), is one component of a comprehensive MRSA colonization surveillance program. It is not intended to diagnose MRSA infection nor to guide or monitor treatment for MRSA infections.      Studies: Dg Swallowing Func-speech Pathology  09/28/2014    Objective Swallowing Evaluation:   Modified Barium Swallow Patient Details  Name: Allison Macdonald MRN: [id number] Date of Birth: May 19, 1926  Today's Date: 09/28/2014 Time: SLP Start Time (ACUTE ONLY): 1125-SLP Stop Time (ACUTE ONLY): 1145 SLP Time Calculation (min) (ACUTE ONLY): 20 min  Past Medical History:  Past Medical History  Diagnosis Date  . Anxiety   .  Hypertension    Past Surgical History:  Past Surgical History  Procedure Laterality Date  . Back surgery    . Abdominal hysterectomy     HPI:  Other Pertinent Information: 81 y.o. female with history of hypertension,   COPD, HTN, anxiety  and chronic back pain was brought to the ER after  patient was found to be having increasing confusion and left facial droop.  CT no acute abnormality. Chronic microvascular ischemic change. Per chart  patient also had a fall 2 days ago which patient states she tripped and  fell and has a small left peri-orbital hematoma. Pt unable to tolerate  MRI. Respiratory distress 6/14 with suspected aspiration, put on Bipap.  CXR stable mild interstitial prominence bilateral without convincing  pulmonary edema. Slight worsening bilateral basilar hazy atelectasis or  infiltrate right greater than left. Question small right pleural effusion.  Indications aspiration  during bedside.  No Data Recorded  Assessment / Plan / Recommendation CHL IP CLINICAL IMPRESSIONS 09/28/2014  Therapy Diagnosis Mild pharyngeal phase dysphagia;Mild oral phase  dysphagia  Clinical Impression Pt. demonstrated mild oropharyngeal dysphagia with  decreased oral cohesion and ability to fully clear oral cavity resulting  in post swallow spill to valleculae and pyriform sinuses. Intermittent  penetration with thin (flash x 1 and deep) resulting in strong cough  reflex. Penetration not observed with therapeutic strategy using chin  tuck. Minimal sporadic vallecular residue due to mildly decreased tongue  based retraction. SLP recommends Dys 2, thin liquids, chin tuck with  liquids, pills whole in applesauce, full supervision for strategy and  continue ST intervention and education.      CHL IP TREATMENT RECOMMENDATION 09/28/2014  Treatment Recommendations Therapy as outlined in treatment plan below     CHL IP DIET RECOMMENDATION 09/28/2014  SLP Diet Recommendations Dysphagia 2 (Fine chop);Thin  Liquid Administration via (None)  Medication Administration Whole meds with puree  Compensations Slow rate;Small sips/bites;Chin tuck  Postural Changes and/or Swallow Maneuvers (None)     CHL IP OTHER RECOMMENDATIONS 09/28/2014  Recommended Consults (None)  Oral Care Recommendations Oral care BID  Other Recommendations (None)    CHL IP FOLLOW UP RECOMMENDATIONS 09/28/2014  Follow up Recommendations Inpatient Rehab     CHL IP FREQUENCY AND DURATION 09/28/2014  Speech Therapy Frequency (ACUTE ONLY) min 2x/week  Treatment Duration 2 weeks     Pertinent Vitals/Pain none    SLP Swallow Goals No flowsheet data found.  No flowsheet data found.    CHL IP REASON FOR REFERRAL 09/28/2014  Reason for Referral Objectively evaluate swallowing function     CHL IP ORAL PHASE 09/28/2014  Lips (None)  Tongue (None)  Mucous membranes (None)  Nutritional status (None)  Other (None)  Oxygen therapy (None)  Oral Phase Impaired  Oral - Pudding  Teaspoon (None)  Oral - Pudding Cup (None)  Oral - Honey Teaspoon (None)  Oral - Honey Cup (None)  Oral - Honey Syringe (None)  Oral - Nectar Teaspoon (None)  Oral - Nectar Cup (None)  Oral - Nectar Straw (None)  Oral - Nectar Syringe (None)  Oral - Ice Chips (None)  Oral - Thin Teaspoon (None)  Oral - Thin Cup (None)  Oral - Thin Straw (None)  Oral - Thin Syringe (None)  Oral - Puree (None)  Oral - Mechanical Soft (None)  Oral - Regular (None)  Oral - Multi-consistency (None)  Oral - Pill (None)  Oral Phase - Comment (None)      CHL IP PHARYNGEAL PHASE  09/28/2014  Pharyngeal Phase Impaired  Pharyngeal - Pudding Teaspoon (None)  Penetration/Aspiration details (pudding teaspoon) (None)  Pharyngeal - Pudding Cup (None)  Penetration/Aspiration details (pudding cup) (None)  Pharyngeal - Honey Teaspoon (None)  Penetration/Aspiration details (honey teaspoon) (None)  Pharyngeal - Honey Cup (None)  Penetration/Aspiration details (honey cup) (None)  Pharyngeal - Honey Syringe (None)  Penetration/Aspiration details (honey syringe) (None)  Pharyngeal - Nectar Teaspoon (None)  Penetration/Aspiration details (nectar teaspoon) (None)  Pharyngeal - Nectar Cup (None)  Penetration/Aspiration details (nectar cup) (None)  Pharyngeal - Nectar Straw (None)  Penetration/Aspiration details (nectar straw) (None)  Pharyngeal - Nectar Syringe (None)  Penetration/Aspiration details (nectar syringe) (None)  Pharyngeal - Ice Chips (None)  Penetration/Aspiration details (ice chips) (None)  Pharyngeal - Thin Teaspoon (None)  Penetration/Aspiration details (thin teaspoon) (None)  Pharyngeal - Thin Cup (None)  Penetration/Aspiration details (thin cup) (None)  Pharyngeal - Thin Straw (None)  Penetration/Aspiration details (thin straw) (None)  Pharyngeal - Thin Syringe (None)  Penetration/Aspiration details (thin syringe') (None)  Pharyngeal - Puree (None)  Penetration/Aspiration details (puree) (None)  Pharyngeal - Mechanical Soft (None)   Penetration/Aspiration details (mechanical soft) (None)  Pharyngeal - Regular (None)  Penetration/Aspiration details (regular) (None)  Pharyngeal - Multi-consistency (None)  Penetration/Aspiration details (multi-consistency) (None)  Pharyngeal - Pill (None)  Penetration/Aspiration details (pill) (None)  Pharyngeal Comment (None)      CHL IP CERVICAL ESOPHAGEAL PHASE 09/28/2014  Cervical Esophageal Phase WFL  Pudding Teaspoon (None)  Pudding Cup (None)  Honey Teaspoon (None)  Honey Cup (None)  Honey Straw (None)  Nectar Teaspoon (None)  Nectar Cup (None)  Nectar Straw (None)  Nectar Sippy Cup (None)  Thin Teaspoon (None)  Thin Cup (None)  Thin Straw (None)  Thin Sippy Cup (None)  Cervical Esophageal Comment (None)    No flowsheet data found.         Royce Macadamia 09/28/2014, 12:19 PM  Breck Coons Lonell Face.Ed CCC-SLP Pager [phone]      Scheduled Meds: . antiseptic oral rinse  7 mL Mouth Rinse q12n4p  . aspirin  300 mg Rectal Daily   Or  . aspirin  325 mg Oral Daily  . chlorhexidine  15 mL Mouth Rinse BID  . furosemide  40 mg Intravenous BID  . insulin aspart  0-9 Units Subcutaneous 6 times per day  . ipratropium-albuterol  3 mL Nebulization TID  . potassium chloride  10 mEq Oral TID   Continuous Infusions: . heparin 1,200 Units/hr (09/27/14 1851)    Time 35 ,min  Tamarah Bhullar L  [facility] Pager [phone]. If 7PM-7AM, please contact night-coverage at [email or web address], password [facility] 09/29/2014, 10:08 AM  LOS: 3 days

## 2014-09-29 NOTE — Progress Notes (Signed)
Rehab admissions - I met with pt and her two sons in follow up to rehab consult to share that rehab MD is recommending home with home health/family support. Per MD, "Pt lives with son. She prefers to go home. She is at a min assist to contact guard assist level already. Recommend HH when medically stable for dc."  Pt and her two sons had no further questions and are comfortable with the plan for home with home health when medically ready.  I will now sign off pt's case. Thanks.  Nanetta Batty, PT Rehabilitation Admissions Coordinator [phone]

## 2014-09-30 DIAGNOSIS — J9601 Acute respiratory failure with hypoxia: Secondary | ICD-10-CM

## 2014-09-30 DIAGNOSIS — I5181 Takotsubo syndrome: Secondary | ICD-10-CM

## 2014-09-30 LAB — GLUCOSE, CAPILLARY
Glucose-Capillary: 115 mg/dL — ABNORMAL HIGH (ref 65–99)
Glucose-Capillary: 128 mg/dL — ABNORMAL HIGH (ref 65–99)
Glucose-Capillary: 138 mg/dL — ABNORMAL HIGH (ref 65–99)
Glucose-Capillary: 117 mg/dL — ABNORMAL HIGH (ref 65–99)
Glucose-Capillary: 113 mg/dL — ABNORMAL HIGH (ref 65–99)

## 2014-09-30 LAB — BASIC METABOLIC PANEL
Anion gap: 10 (ref 5–15)
BUN: 41 mg/dL — AB (ref 6–20)
CALCIUM: 8.8 mg/dL — AB (ref 8.9–10.3)
CO2: 37 mmol/L — AB (ref 22–32)
Chloride: 93 mmol/L — ABNORMAL LOW (ref 101–111)
Creatinine, Ser: 1.06 mg/dL — ABNORMAL HIGH (ref 0.44–1.00)
GFR calc Af Amer: 54 mL/min — ABNORMAL LOW (ref >60)
GFR, EST NON AFRICAN AMERICAN: 47 mL/min — AB (ref >60)
GLUCOSE: 119 mg/dL — AB (ref 65–99)
Potassium: 3.5 mmol/L (ref 3.5–5.1)
Sodium: 140 mmol/L (ref 135–145)

## 2014-09-30 MED ORDER — RESOURCE THICKENUP CLEAR PO POWD: ORAL | Status: AC

## 2014-09-30 MED ORDER — FUROSEMIDE 40 MG PO TABS: 40.0000 mg | ORAL_TABLET | Freq: Two times a day (BID) | ORAL | Status: AC

## 2014-09-30 MED ORDER — POTASSIUM CHLORIDE CRYS ER 10 MEQ PO TBCR: 10.0000 meq | EXTENDED_RELEASE_TABLET | Freq: Three times a day (TID) | ORAL | Status: AC

## 2014-09-30 MED ORDER — FUROSEMIDE 40 MG PO TABS
40.0000 mg | ORAL_TABLET | Freq: Two times a day (BID) | ORAL | Status: DC
  Administered 2014-09-30: 40 mg via ORAL
  Filled 2014-09-30: qty 1

## 2014-09-30 MED ORDER — APIXABAN 5 MG PO TABS: 5.0000 mg | ORAL_TABLET | Freq: Two times a day (BID) | ORAL | Status: AC

## 2014-09-30 MED ORDER — IPRATROPIUM-ALBUTEROL 0.5-2.5 (3) MG/3ML IN SOLN: 3.0000 mL | Freq: Four times a day (QID) | RESPIRATORY_TRACT | Status: DC | PRN

## 2014-09-30 NOTE — Care Management Note (Signed)
Case Management Note  Patient Details  Name: Allison Macdonald MRN: [id number] Date of Birth: 1926/04/18  Subjective/Objective:                    Action/Plan: Patient is set up with [facility] for [facility] services.  Eliquis 30 day free card was provided, and patient was made aware of $7 copay.  Attending MD is aware of the need for prior authorization.  Expected Discharge Date:                  Expected Discharge Plan:  Home w Home Health Services  In-House Referral:     Discharge planning Services  CM Consult  Post Acute Care Choice:  Home Health Choice offered to:     DME Arranged:    DME Agency:     HH Arranged:  RN, PT HH Agency:  [facility]  Status of Service:     Medicare Important Message Given:  Yes Date Medicare IM Given:  09/30/14 Medicare IM give by:  Elmer Bales RN, MSN, CM Date Additional Medicare IM Given:    Additional Medicare Important Message give by:     If discussed at Long Length of Stay Meetings, dates discussed:    Additional Comments:  Anda Kraft, RN 09/30/2014, 10:32 AM

## 2014-09-30 NOTE — Progress Notes (Signed)
Occupational Therapy Treatment Patient Details Name: Allison Macdonald MRN: [id number] DOB: 1927/04/04 Today's Date: 09/30/2014    History of present illness 81 y.o. female with history of hypertension, COPD, HTN, anxiety and chronic back pain was brought to the ER after patient was found to be having increasing confusion and left facial droop. CT no acute abnormality. Chronic microvascular ischemic change. Per chart patient also had a fall 2 days ago which patient states she tripped and fell and has a small left peri-orbital hematoma.   OT comments  Pt in chair upon arrival, waiting to go home. Pt reports blurry vision which was corrected by wearing glasses. Pt able to complete dressing with supervision. Pt able to locate items on L side of sink. Pt reports feeling off balance, no loss of balance noted when ambulating to bathroom. Pt declined CIR, would prefer to go home with son and receive HH. Pt states 2 sons available upon d/c to [facility] with ADLs as needed. Pt would benefit from continued OT for safety and independence with ADL completion.    Follow Up Recommendations  Home health OT (Pt refused CIR, requests HH)    Equipment Recommendations  None recommended by OT    Recommendations for Other Services      Precautions / Restrictions Precautions Precautions: Fall Restrictions Weight Bearing Restrictions: No       Mobility Bed Mobility               General bed mobility comments: in chair  Transfers Overall transfer level: Needs assistance Equipment used: None Transfers: Sit to/from Stand Sit to Stand: Min guard              Balance Overall balance assessment: Needs assistance Sitting-balance support: Feet supported;No upper extremity supported       Standing balance support: No upper extremity supported;During functional activity                       ADL Overall ADL's : Needs assistance/impaired     Grooming: Wash/dry hands;Wash/dry  face;Supervision/safety;Standing           Upper Body Dressing : Supervision/safety;Sitting       Toilet Transfer: Min guard;Ambulation;Regular Toilet   Toileting- Architect and Hygiene: Independent;Sit to/from stand       Functional mobility during ADLs: Min guard        Vision                 Additional Comments: Pt reports blurred vision; corrected with glasses   Perception     Praxis      Cognition   Behavior During Therapy: WFL for tasks assessed/performed Overall Cognitive Status: No family/caregiver present to determine baseline cognitive functioning                  General Comments: Pt able to find items on L side of sink this morning without cuing    Extremity/Trunk Assessment               Exercises     Shoulder Instructions       General Comments      Pertinent Vitals/ Pain       Pain Assessment: No/denies pain  Home Living                                          Prior  Functioning/Environment              Frequency Min 3X/week     Progress Toward Goals  OT Goals(current goals can now be found in the care plan section)  Progress towards OT goals: Progressing toward goals  Acute Rehab OT Goals Patient Stated Goal: Get back home OT Goal Formulation: With patient Time For Goal Achievement: 10/12/14 Potential to Achieve Goals: Good ADL Goals Pt Will Perform Grooming: with supervision;standing Pt Will Perform Upper Body Bathing: with supervision;sitting Pt Will Perform Lower Body Bathing: with supervision;sit to/from stand Pt Will Perform Upper Body Dressing: with supervision;sitting Pt Will Perform Lower Body Dressing: with supervision;sit to/from stand Pt Will Transfer to Toilet: with supervision;ambulating;regular height toilet Pt Will Perform Toileting - Clothing Manipulation and hygiene: with supervision;sit to/from stand Additional ADL Goal #1: Pt will locate ADL items on L  side with minimal verbal cues.  Plan Discharge plan needs to be updated    Co-evaluation                 End of Session Equipment Utilized During Treatment: Gait belt   Activity Tolerance Patient tolerated treatment well   Patient Left in chair;with call bell/phone within reach;with nursing/sitter in room   Nurse Communication Mobility status        Time: 1000-1015 OT Time Calculation (min): 15 min  Charges: OT General Charges $OT Visit: 1 Procedure OT Treatments $Self Care/Home Management : 8-22 mins  Marden Noble 09/30/2014, 12:27 PM

## 2014-09-30 NOTE — Progress Notes (Addendum)
Met with patient to discuss discharge needs.  Patient is being prescribed Eliquis at discharge.  Benefits check is pending to determine patient's copay.  Patient was set up with [facility] by previous CM.  This CM spoke with Miranda with [facility] to notify of plans for discharge home today.  CM will continue to follow with results of benefits check.  UPDATE: Benefits check results  ELIQUIS 5 MG BID   COVER- YES  CO-PAY- $ 7.00 FOR 60 PILLS 30 DAYS         90 DAY SUPPLY FOR MAIL ORDER $ 7.00  TIER- 3 DRUG  PRIOR APPROVAL - YES # [phone]

## 2014-09-30 NOTE — Progress Notes (Signed)
Discharge instructions given. Pt verbalized understanding and all questions were answered.  

## 2014-09-30 NOTE — Discharge Summary (Addendum)
Physician Discharge Summary  Allison Macdonald:[id number] DOB: 1927/02/03 DOA: 09/26/2014  PCP: Feliciana Rossetti, MD  Admit date: 09/26/2014 Discharge date: 09/30/2014  Time spent: 35 minutes  Recommendations for Outpatient Follow-up:  Repeat echo outpatient BMP 1 week- re Cr DYS 2 nectar thick -home health   Discharge Diagnoses:  Principal Problem:   Stroke Active Problems:   Hypertension   Elevated troponin   COPD exacerbation   Atrial flutter   Acute respiratory failure   Generalized anxiety disorder   Essential hypertension   Acute on chronic systolic CHF (congestive heart failure)   Hypokalemia   Takotsubo syndrome   Discharge Condition: improved  Diet recommendation: DYS 2 nectar thick  Filed Weights   09/27/14 1033 09/28/14 0330 09/29/14 0350  Weight: 85.9 kg (189 lb 6 oz) 83.5 kg (184 lb 1.4 oz) 81.5 kg (179 lb 10.8 oz)    History of present illness:  Allison Macdonald is a 81 y.o. female with history of hypertension and COPD and chronic back pain was brought to the ER after patient was found to be having increasing confusion and left facial droop. As per patient's husband patient became suddenly confused with left facial droop around 5:10 PM this evening. Patient's husband called EMS and patient was brought to the ER. After reaching ER patient became oriented but left facial droop persisted and on-call neurologist Dr. Amada Jupiter was consulted for acute stroke and CT head did not show anything acute and on exam patient had left facial droop with some left-sided weakness. Patient also had a fall 2 days ago which patient states she tripped and fell and has a small left peri-orbital hematoma. Patient's EKG shows atrial flutter with deep wave inversion. Troponin is mildly elevated and on call cardiologist Dr. Anne Fu also consulted. Patient denies any chest pain or shortness of breath diaphoresis. Patient denies any visual symptoms difficulty swallowing or speaking  Hospital  Course:  Stroke:  Refused MRI and CT angiogram of the head. Still with left facial droop, but no focal weakness otherwise. Started on dysphagia 2 diet with thin liquids yesterday but started choking on thins so has been reevaluated and is now on thickened liquids. Refused CIR placement -home health Dr. Pearlean Brownie: Change to eliquis LDL at goal, no statin Echo Left ventricle: The cavity size was normal. Systolic function was severely reduced. The estimated ejection fraction was in the range of 20% to 25%. There is akinesis of the mid-apical anteroseptal, anterior, anterolateral, lateral, inferior, and apical myocardium. Has Takotsubo configuration . Doppler parameters are consistent with a reversible restrictive pattern, indicative of decreased left ventricular diastolic compliance and/or increased left atrial pressure (grade 3 diastolic dysfunction). - Mitral valve: There was mild regurgitation. - Right ventricle: The cavity size was moderately dilated. Wall thickness was normal. Systolic function was moderately reduced. - Pulmonary arteries: Systolic pressure was mildly to moderately increased. PA peak pressure: 48 mm Hg (S). Impressions: - Takotsubo appearance. Correlates with low level troponin and T wave inversion on ECG.  Carotid Dopplers Preliminary results by tech - Carotid Duplex Completed. Mild plaque visualized in both ICA with no evidence of stenosis noted.   LDL 62, hemoglobin A1c 5.7    Acute respiratory failure: Secondary to acute pulmonary edema with COPD and possible aspiration component. Much better. Off O2  Cardiomyopathy, Takatsubo.  -repeat echocardiogram as an outpatient. No ischemia workup at this time. Heart rate too low at times for beta blocker. Increased BUN and creatinine precludes ACE inhibitor at this time per cardiology  note.   COPD exacerbation: Much improved.  Dysphagia: See above   Elevated troponin: trend flat. EKG is  abnormal with fairly diffuse T-wave inversions, consistent with Takatsubo CM   Atrial flutter: No known previous history of same.  TSH normal -neuro recommends eliquis   Generalized anxiety disorder: Continue Ativan as needed.   Essential hypertension: meds held  Hypokalemia: Repleted   Procedures:  echo  Consultations:  Neuro  cards  Discharge Exam: Filed Vitals:   09/30/14 0933  BP: 119/46  Pulse: 66  Temp: 97.5 F (36.4 C)  Resp: 18    General: A+Ox3, NAD   Discharge Instructions   Discharge Instructions    Discharge instructions    Complete by:  As directed   Home health DYS 2 nectar thick BMP 1-2 weeks- Cr     Increase activity slowly    Complete by:  As directed           Current Discharge Medication List    START taking these medications   Details  apixaban (ELIQUIS) 5 MG TABS tablet Take 1 tablet (5 mg total) by mouth 2 (two) times daily. Qty: 60 tablet, Refills: 0    furosemide (LASIX) 40 MG tablet Take 1 tablet (40 mg total) by mouth 2 (two) times daily. Qty: 60 tablet, Refills: 0    Maltodextrin-Xanthan Gum (RESOURCE THICKENUP CLEAR) POWD As needed Qty: 1 Can, Refills: 1    potassium chloride (K-DUR,KLOR-CON) 10 MEQ tablet Take 1 tablet (10 mEq total) by mouth 3 (three) times daily. Qty: 90 tablet, Refills: 0      CONTINUE these medications which have NOT CHANGED   Details  HYDROcodone-acetaminophen (NORCO/VICODIN) 5-325 MG per tablet Take 1-2 tablets by mouth every 6 (six) hours as needed for moderate pain.     LORazepam (ATIVAN) 1 MG tablet Take 1 mg by mouth 2 (two) times daily as needed for anxiety.     sertraline (ZOLOFT) 50 MG tablet Take 75 mg by mouth daily.      STOP taking these medications     Diclofenac-Misoprostol 75-0.2 MG TBEC      estradiol (ESTRACE) 0.5 MG tablet      lisinopril (PRINIVIL,ZESTRIL) 5 MG tablet        Allergies  Allergen Reactions  . Penicillins    Follow-up Information     Follow up with SETHI,PRAMOD, MD In 2 months.   Specialties:  Neurology, Radiology   Why:  stroke clinic, office will call you for follow up appointment, call earlier if symptoms worsen or new neurologica   Contact information:   [address] [place] [address] [phone]       Please follow up.   Why:  PCP 1 week       The results of significant diagnostics from this hospitalization (including imaging, microbiology, ancillary and laboratory) are listed below for reference.    Significant Diagnostic Studies: Dg Chest 2 View  09/26/2014   CLINICAL DATA:  A stroke.  Shortness of breath.  EXAM: CHEST  2 VIEW  COMPARISON:  12/15/2013  FINDINGS: Mild cardiac enlargement. Pulmonary vascularity appears normal. Interstitial changes in the lungs similar prior study suggesting chronic fibrosis. No focal airspace disease or consolidation. No blunting of costophrenic angles. No pneumothorax. Degenerative changes in the spine.  IMPRESSION: Cardiac enlargement. Chronic interstitial pattern. No evidence of active pulmonary disease.   Electronically Signed   By: Burman Nieves M.D.   On: 09/26/2014 22:57   Ct Head Wo Contrast  09/26/2014   CLINICAL DATA:  Left facial droop.  EXAM: CT HEAD WITHOUT CONTRAST  TECHNIQUE: Contiguous axial images were obtained from the base of the skull through the vertex without intravenous contrast.  COMPARISON:  None.  FINDINGS: Chronic microvascular ischemic change is noted. No evidence of acute intracranial abnormality including hemorrhage, infarct, mass lesion, mass effect, midline shift or abnormal extra-axial fluid collection is seen. No hydrocephalus or pneumocephalus. The calvarium is intact. Imaged paranasal sinuses demonstrate a very small mucous retention cyst or polyp in the right maxillary.  IMPRESSION: No acute abnormality.  Chronic microvascular ischemic change.   Electronically Signed   By: Drusilla Kanner M.D.   On: 09/26/2014 18:59   Dg  Chest Port 1 View  09/27/2014   CLINICAL DATA:  Dyspnea  EXAM: PORTABLE CHEST - 1 VIEW  COMPARISON:  09/26/2014  FINDINGS: Cardiomegaly again noted. Stable mild interstitial prominence bilateral without convincing pulmonary edema. Slight worsening bilateral basilar hazy atelectasis or infiltrate right greater than left. Question small right pleural effusion.  IMPRESSION: Stable mild interstitial prominence bilateral without convincing pulmonary edema. Slight worsening bilateral basilar hazy atelectasis or infiltrate right greater than left. Question small right pleural effusion.   Electronically Signed   By: Natasha Mead M.D.   On: 09/27/2014 08:23   Dg Swallowing Func-speech Pathology  09/28/2014    Objective Swallowing Evaluation:   Modified Barium Swallow Patient Details  Name: Allison Macdonald MRN: [id number] Date of Birth: June 19, 1926  Today's Date: 09/28/2014 Time: SLP Start Time (ACUTE ONLY): 1125-SLP Stop Time (ACUTE ONLY): 1145 SLP Time Calculation (min) (ACUTE ONLY): 20 min  Past Medical History:  Past Medical History  Diagnosis Date  . Anxiety   . Hypertension    Past Surgical History:  Past Surgical History  Procedure Laterality Date  . Back surgery    . Abdominal hysterectomy     HPI:  Other Pertinent Information: 81 y.o. female with history of hypertension,   COPD, HTN, anxiety  and chronic back pain was brought to the ER after  patient was found to be having increasing confusion and left facial droop.  CT no acute abnormality. Chronic microvascular ischemic change. Per chart  patient also had a fall 2 days ago which patient states she tripped and  fell and has a small left peri-orbital hematoma. Pt unable to tolerate  MRI. Respiratory distress 6/14 with suspected aspiration, put on Bipap.  CXR stable mild interstitial prominence bilateral without convincing  pulmonary edema. Slight worsening bilateral basilar hazy atelectasis or  infiltrate right greater than left. Question small right pleural effusion.   Indications aspiration during bedside.  No Data Recorded  Assessment / Plan / Recommendation CHL IP CLINICAL IMPRESSIONS 09/28/2014  Therapy Diagnosis Mild pharyngeal phase dysphagia;Mild oral phase  dysphagia  Clinical Impression Pt. demonstrated mild oropharyngeal dysphagia with  decreased oral cohesion and ability to fully clear oral cavity resulting  in post swallow spill to valleculae and pyriform sinuses. Intermittent  penetration with thin (flash x 1 and deep) resulting in strong cough  reflex. Penetration not observed with therapeutic strategy using chin  tuck. Minimal sporadic vallecular residue due to mildly decreased tongue  based retraction. SLP recommends Dys 2, thin liquids, chin tuck with  liquids, pills whole in applesauce, full supervision for strategy and  continue ST intervention and education.      CHL IP TREATMENT RECOMMENDATION 09/28/2014  Treatment Recommendations Therapy as outlined in treatment plan below     CHL IP DIET RECOMMENDATION 09/28/2014  SLP Diet Recommendations Dysphagia 2 (Fine chop);Thin  Liquid Administration via (None)  Medication Administration Whole meds with puree  Compensations Slow rate;Small sips/bites;Chin tuck  Postural Changes and/or Swallow Maneuvers (None)     CHL IP OTHER RECOMMENDATIONS 09/28/2014  Recommended Consults (None)  Oral Care Recommendations Oral care BID  Other Recommendations (None)    CHL IP FOLLOW UP RECOMMENDATIONS 09/28/2014  Follow up Recommendations Inpatient Rehab     CHL IP FREQUENCY AND DURATION 09/28/2014  Speech Therapy Frequency (ACUTE ONLY) min 2x/week  Treatment Duration 2 weeks     Pertinent Vitals/Pain none    SLP Swallow Goals No flowsheet data found.  No flowsheet data found.    CHL IP REASON FOR REFERRAL 09/28/2014  Reason for Referral Objectively evaluate swallowing function     CHL IP ORAL PHASE 09/28/2014  Lips (None)  Tongue (None)  Mucous membranes (None)  Nutritional status (None)  Other (None)  Oxygen therapy (None)  Oral Phase  Impaired  Oral - Pudding Teaspoon (None)  Oral - Pudding Cup (None)  Oral - Honey Teaspoon (None)  Oral - Honey Cup (None)  Oral - Honey Syringe (None)  Oral - Nectar Teaspoon (None)  Oral - Nectar Cup (None)  Oral - Nectar Straw (None)  Oral - Nectar Syringe (None)  Oral - Ice Chips (None)  Oral - Thin Teaspoon (None)  Oral - Thin Cup (None)  Oral - Thin Straw (None)  Oral - Thin Syringe (None)  Oral - Puree (None)  Oral - Mechanical Soft (None)  Oral - Regular (None)  Oral - Multi-consistency (None)  Oral - Pill (None)  Oral Phase - Comment (None)      CHL IP PHARYNGEAL PHASE 09/28/2014  Pharyngeal Phase Impaired  Pharyngeal - Pudding Teaspoon (None)  Penetration/Aspiration details (pudding teaspoon) (None)  Pharyngeal - Pudding Cup (None)  Penetration/Aspiration details (pudding cup) (None)  Pharyngeal - Honey Teaspoon (None)  Penetration/Aspiration details (honey teaspoon) (None)  Pharyngeal - Honey Cup (None)  Penetration/Aspiration details (honey cup) (None)  Pharyngeal - Honey Syringe (None)  Penetration/Aspiration details (honey syringe) (None)  Pharyngeal - Nectar Teaspoon (None)  Penetration/Aspiration details (nectar teaspoon) (None)  Pharyngeal - Nectar Cup (None)  Penetration/Aspiration details (nectar cup) (None)  Pharyngeal - Nectar Straw (None)  Penetration/Aspiration details (nectar straw) (None)  Pharyngeal - Nectar Syringe (None)  Penetration/Aspiration details (nectar syringe) (None)  Pharyngeal - Ice Chips (None)  Penetration/Aspiration details (ice chips) (None)  Pharyngeal - Thin Teaspoon (None)  Penetration/Aspiration details (thin teaspoon) (None)  Pharyngeal - Thin Cup (None)  Penetration/Aspiration details (thin cup) (None)  Pharyngeal - Thin Straw (None)  Penetration/Aspiration details (thin straw) (None)  Pharyngeal - Thin Syringe (None)  Penetration/Aspiration details (thin syringe') (None)  Pharyngeal - Puree (None)  Penetration/Aspiration details (puree) (None)  Pharyngeal - Mechanical  Soft (None)  Penetration/Aspiration details (mechanical soft) (None)  Pharyngeal - Regular (None)  Penetration/Aspiration details (regular) (None)  Pharyngeal - Multi-consistency (None)  Penetration/Aspiration details (multi-consistency) (None)  Pharyngeal - Pill (None)  Penetration/Aspiration details (pill) (None)  Pharyngeal Comment (None)      CHL IP CERVICAL ESOPHAGEAL PHASE 09/28/2014  Cervical Esophageal Phase WFL  Pudding Teaspoon (None)  Pudding Cup (None)  Honey Teaspoon (None)  Honey Cup (None)  Honey Straw (None)  Nectar Teaspoon (None)  Nectar Cup (None)  Nectar Straw (None)  Nectar Sippy Cup (None)  Thin Teaspoon (None)  Thin Cup (None)  Thin Straw (None)  Thin Sippy Cup (None)  Cervical Esophageal Comment (None)  No flowsheet data found.         Royce Macadamia 09/28/2014, 12:19 PM  Breck Coons Lon[place] [employer] [phone]      Microbiology: Recent Results (from the past 240 hour(s))  MRSA PCR Screening     Status: None   Collection Time: 09/28/14  9:58 PM  Result Value Ref Range Status   MRSA by PCR NEGATIVE NEGATIVE Final    Comment:        The GeneXpert MRSA Assay (FDA approved for NASAL specimens only), is one component of a comprehensive MRSA colonization surveillance program. It is not intended to diagnose MRSA infection nor to guide or monitor treatment for MRSA infections.      Labs: Basic Metabolic Panel:  Recent Labs Lab 09/26/14 1847 09/26/14 1854 09/27/14 0343 09/29/14 0342 09/30/14 0429  NA 138 140 139 140 140  K 4.1 4.0 4.3 3.2* 3.5  CL 103 102 102 93* 93*  CO2 23  --  27 35* 37*  GLUCOSE 137* 142* 145* 118* 119*  BUN 20 22* 19 38* 41*  CREATININE 1.10* 1.00 0.88 1.04* 1.06*  CALCIUM 9.1  --  8.7* 9.0 8.8*  MG  --   --   --  1.7  --    Liver Function Tests:  Recent Labs Lab 09/26/14 1847 09/27/14 0343  AST 39 37  ALT 31 29  ALKPHOS 113 107  BILITOT 1.1 0.9  PROT 7.2 6.8  ALBUMIN 3.9 3.3*   No results for input(s): LIPASE,  AMYLASE in the last 168 hours. No results for input(s): AMMONIA in the last 168 hours. CBC:  Recent Labs Lab 09/26/14 1847 09/26/14 1854 09/27/14 0343 09/28/14 0255 09/29/14 0342  WBC 10.5  --  8.0 6.3 12.2*  NEUTROABS 5.5  --  5.1  --   --   HGB 12.9 14.6 12.0 12.3 13.4  HCT 39.2 43.0 37.9 38.1 41.8  MCV 93.1  --  93.3 92.7 93.5  PLT 229  --  210 216 293   Cardiac Enzymes:  Recent Labs Lab 09/26/14 2237 09/27/14 0343 09/27/14 0940  TROPONINI 0.47* 0.48* 0.36*   BNP: BNP (last 3 results)  Recent Labs  09/27/14 0940  BNP 1915.4*    ProBNP (last 3 results) No results for input(s): PROBNP in the last 8760 hours.  CBG:  Recent Labs Lab 09/29/14 2004 09/29/14 2359 09/30/14 0358 09/30/14 0804 09/30/14 1123  GLUCAP 113* 128* 115* 117* 113*       Signed:  Benjamine Mola, JESSICA  [facility] 09/30/2014, 1:11 PM

## 2015-09-24 DIAGNOSIS — D62 Acute posthemorrhagic anemia: Secondary | ICD-10-CM

## 2015-09-24 DIAGNOSIS — A419 Sepsis, unspecified organism: Secondary | ICD-10-CM

## 2015-09-24 DIAGNOSIS — R791 Abnormal coagulation profile: Secondary | ICD-10-CM

## 2015-09-24 DIAGNOSIS — I4892 Unspecified atrial flutter: Secondary | ICD-10-CM

## 2015-09-24 DIAGNOSIS — N390 Urinary tract infection, site not specified: Secondary | ICD-10-CM

## 2015-09-24 DIAGNOSIS — N179 Acute kidney failure, unspecified: Secondary | ICD-10-CM

## 2015-09-24 DIAGNOSIS — K922 Gastrointestinal hemorrhage, unspecified: Secondary | ICD-10-CM

## 2015-09-24 DIAGNOSIS — E875 Hyperkalemia: Secondary | ICD-10-CM

## 2015-09-24 DIAGNOSIS — J9601 Acute respiratory failure with hypoxia: Secondary | ICD-10-CM

## 2015-09-27 DIAGNOSIS — A419 Sepsis, unspecified organism: Secondary | ICD-10-CM

## 2015-09-27 DIAGNOSIS — R791 Abnormal coagulation profile: Secondary | ICD-10-CM

## 2015-09-27 DIAGNOSIS — E875 Hyperkalemia: Secondary | ICD-10-CM

## 2015-09-27 DIAGNOSIS — N390 Urinary tract infection, site not specified: Secondary | ICD-10-CM

## 2015-09-27 DIAGNOSIS — D62 Acute posthemorrhagic anemia: Secondary | ICD-10-CM

## 2015-09-27 DIAGNOSIS — J9601 Acute respiratory failure with hypoxia: Secondary | ICD-10-CM

## 2015-09-27 DIAGNOSIS — I4892 Unspecified atrial flutter: Secondary | ICD-10-CM

## 2015-09-27 DIAGNOSIS — K922 Gastrointestinal hemorrhage, unspecified: Secondary | ICD-10-CM

## 2015-09-27 DIAGNOSIS — N179 Acute kidney failure, unspecified: Secondary | ICD-10-CM

## 2015-10-14 DEATH — deceased

## 2016-03-15 IMAGING — CR DG CHEST 2V
2 series · 2 of 2 positions shown · non-contrast
Comparison: 12/15/2013

CLINICAL DATA: A stroke.  Shortness of breath.

EXAM:
CHEST  2 VIEW

[chest lat]
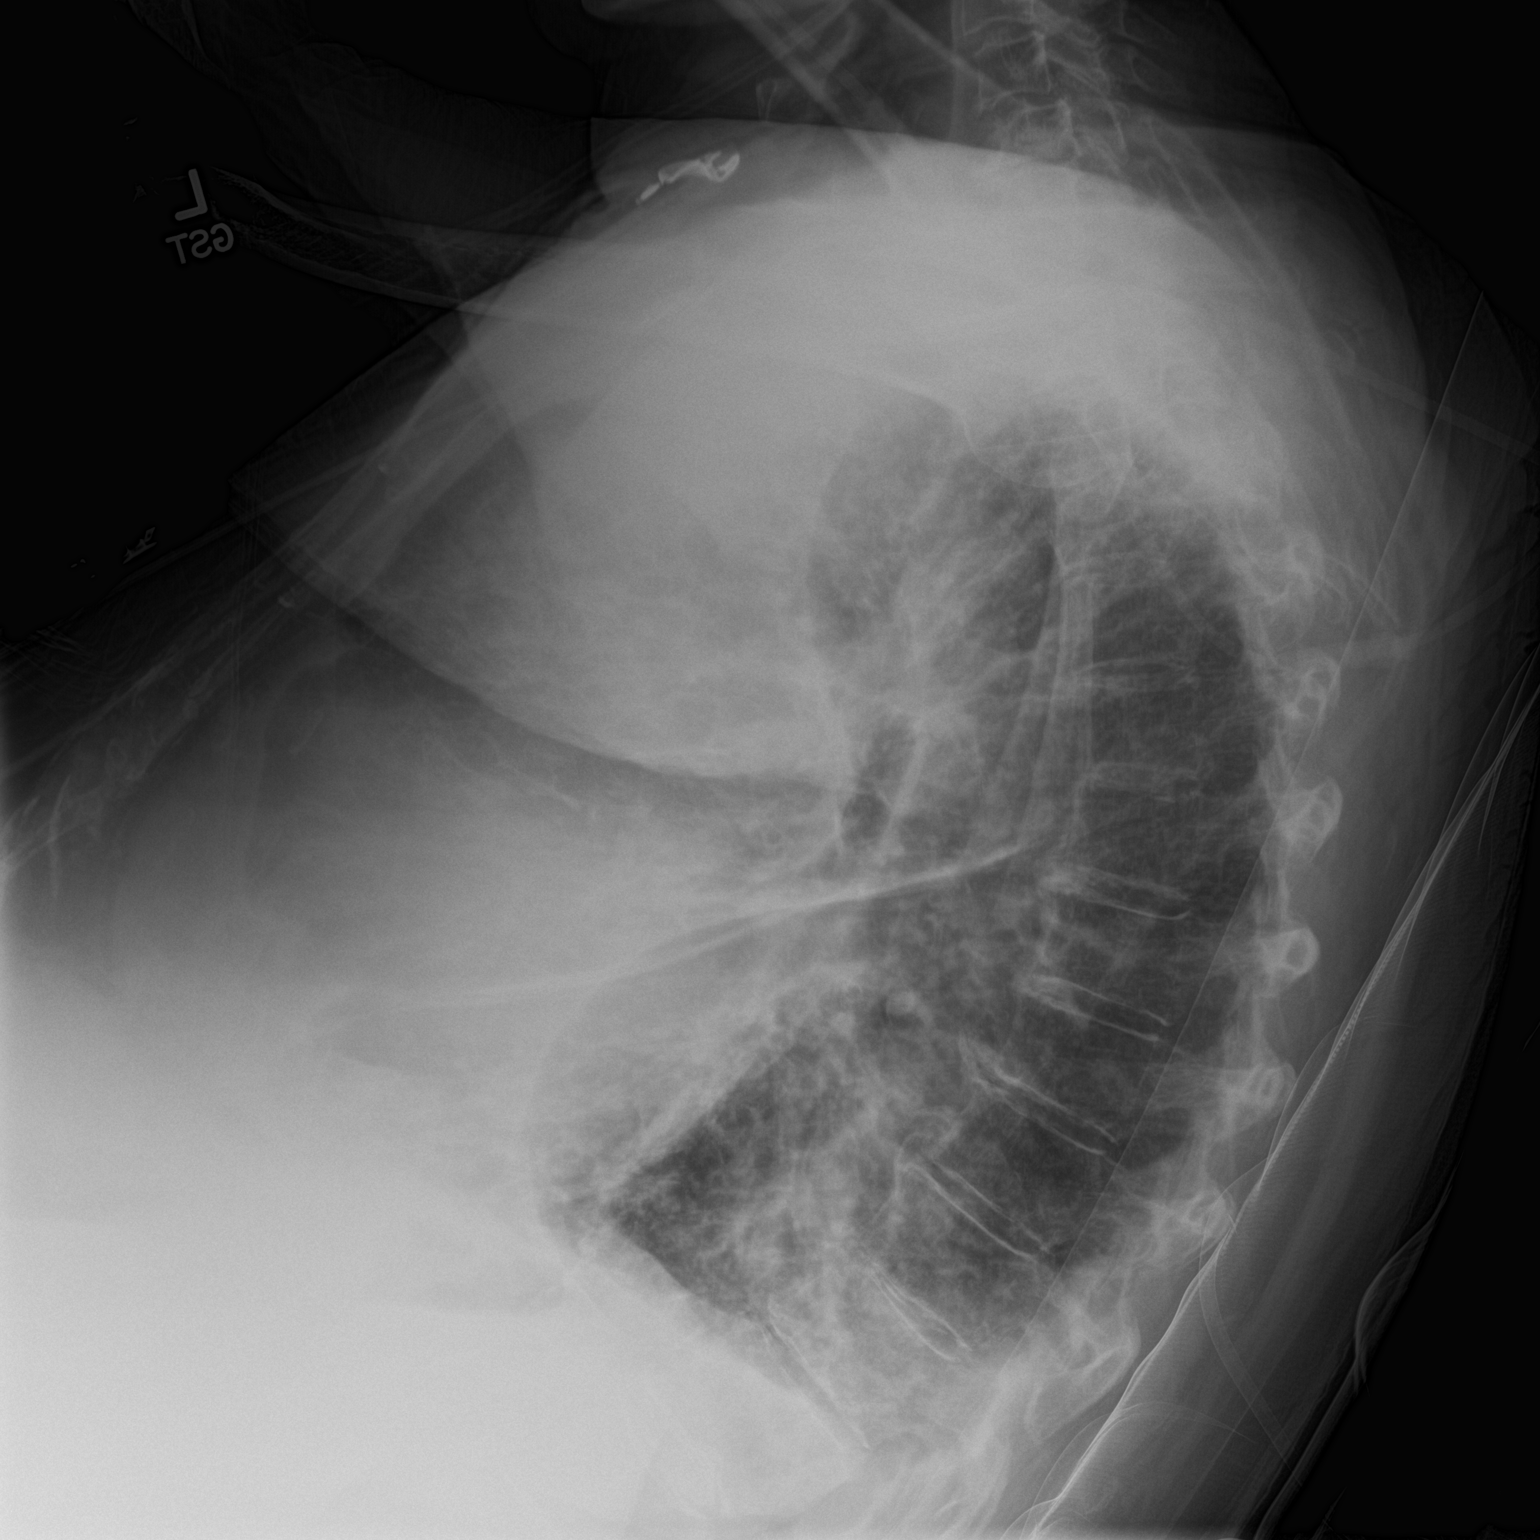

[chest ap]
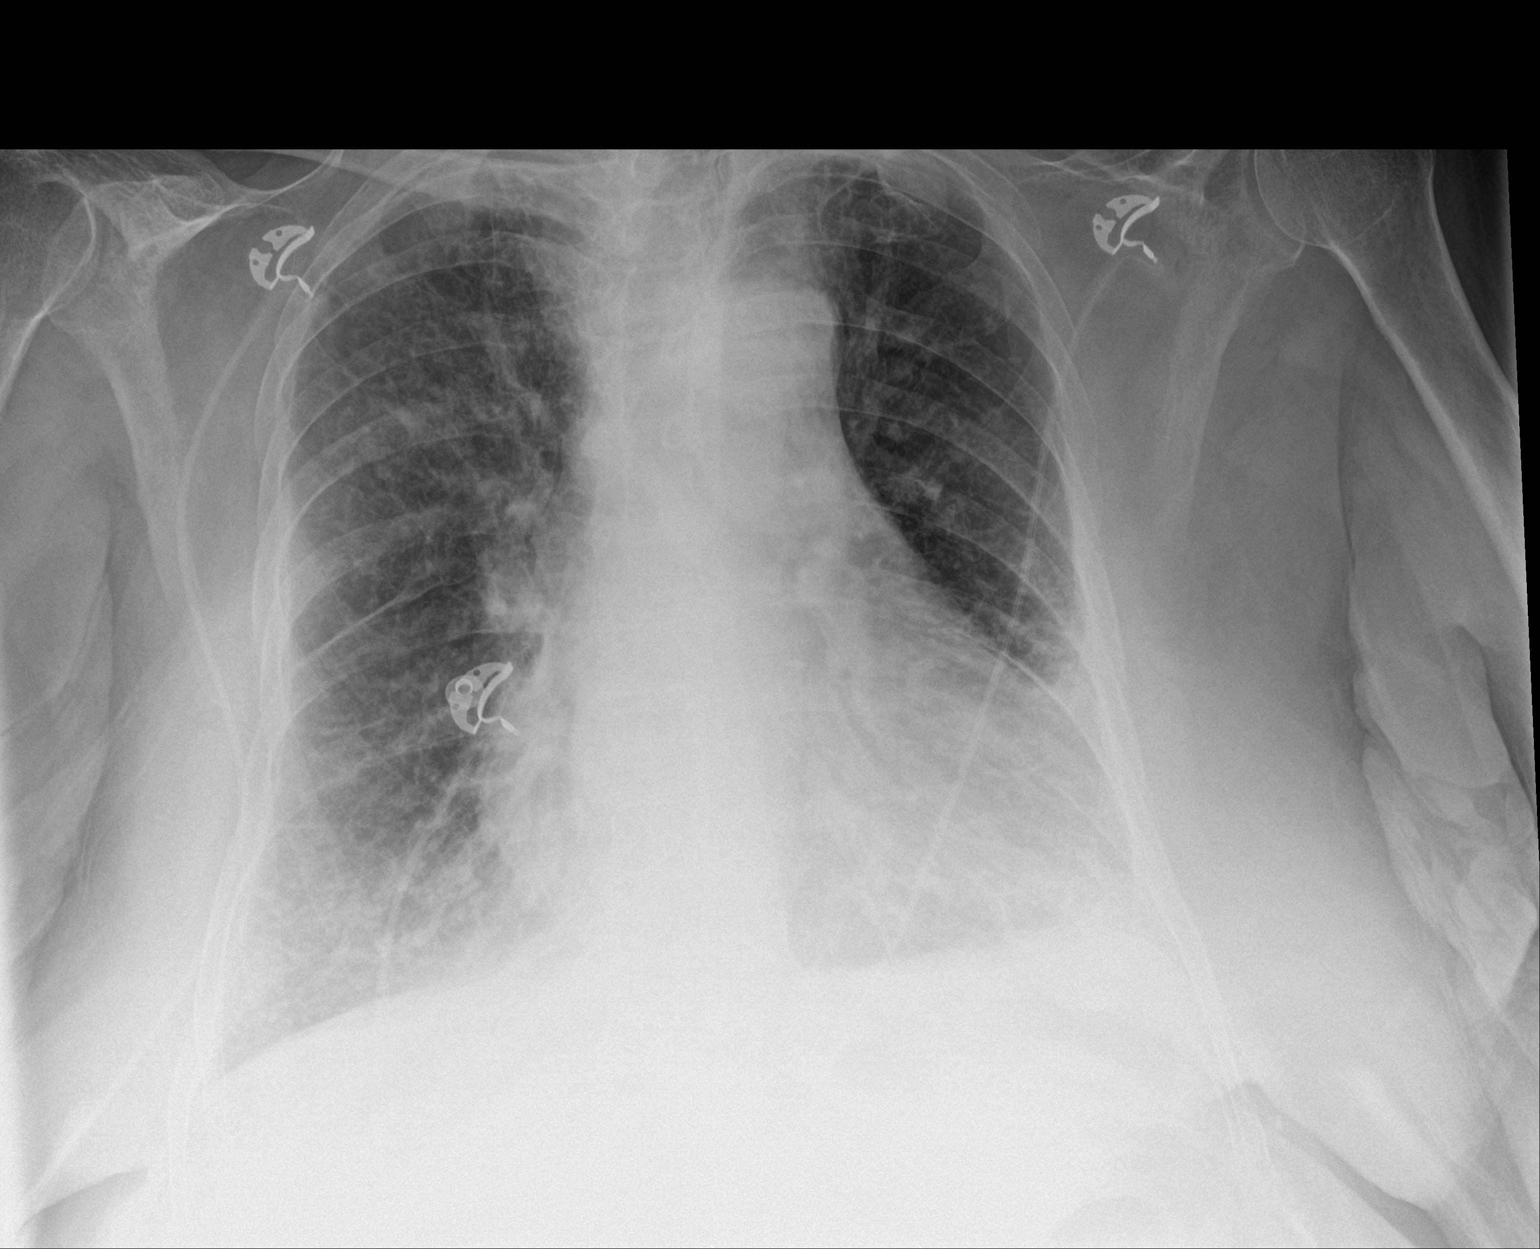

[2 of 2 positions shown; findings below may reference images not displayed]

FINDINGS: Mild cardiac enlargement. Pulmonary vascularity appears normal.
Interstitial changes in the lungs similar prior study suggesting
chronic fibrosis. No focal airspace disease or consolidation. No
blunting of costophrenic angles. No pneumothorax. Degenerative
changes in the spine.
IMPRESSION: Cardiac enlargement. Chronic interstitial pattern. No evidence of
active pulmonary disease.

## 2016-03-15 IMAGING — CT CT HEAD W/O CM
2 series · 16 of 30 positions shown, 20 images · non-contrast
Comparison: None.

CLINICAL DATA: Left facial droop.

EXAM:
CT HEAD WITHOUT CONTRAST
TECHNIQUE: Contiguous axial images were obtained from the base of the skull
through the vertex without intravenous contrast.

[Series 201: head w/o, idose (1) · axial · non-contrast · 0.49mm/px · z∈[+51,+181]mm · 13 of 32 slices shown, 17 images]
[im 3/32  brain]
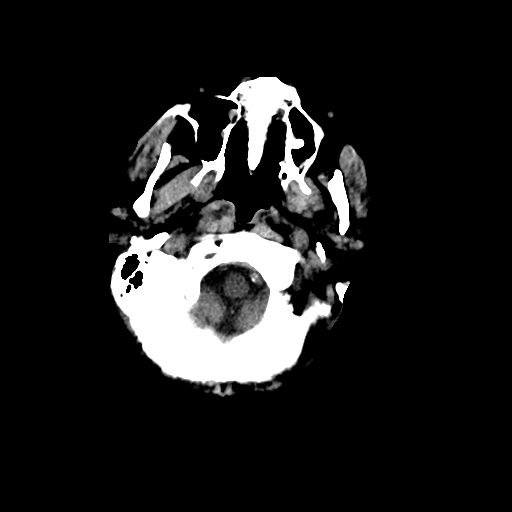
[im 3/32  bone]
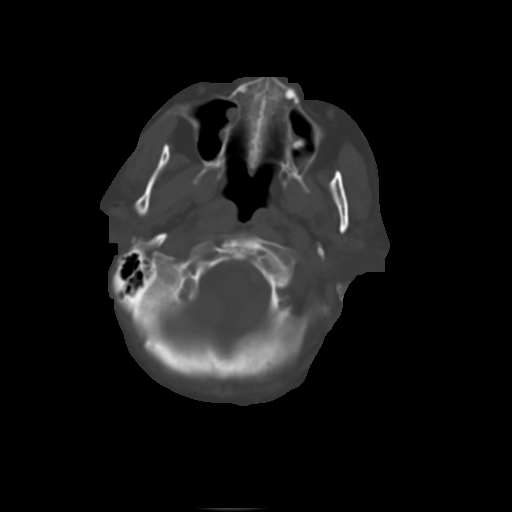
[im 5/32  brain]
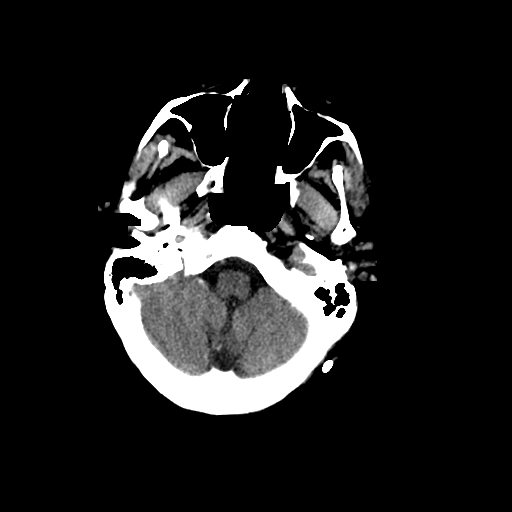
[im 7/32  brain]
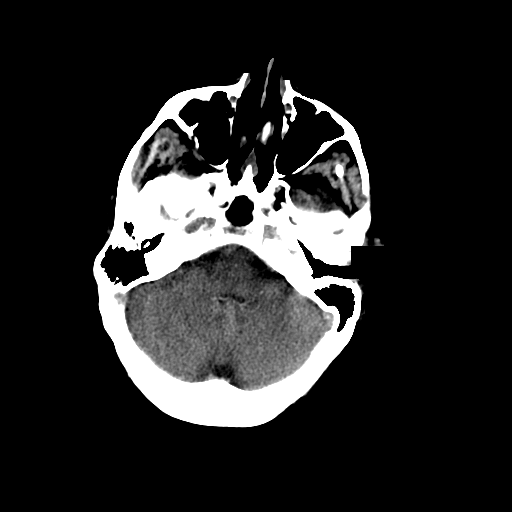
[im 9/32  brain]
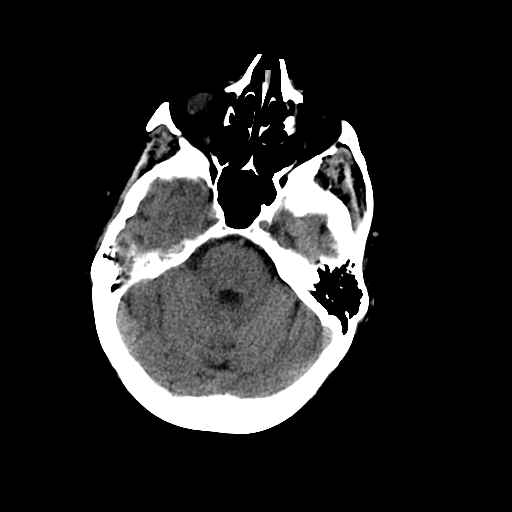
[im 12/32  brain]
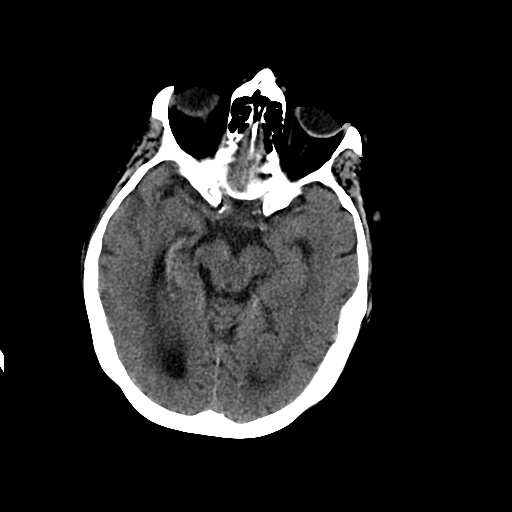
[im 12/32  bone]
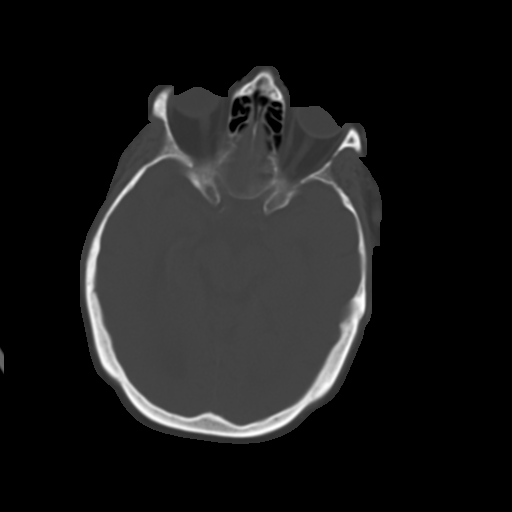
[im 14/32  brain]
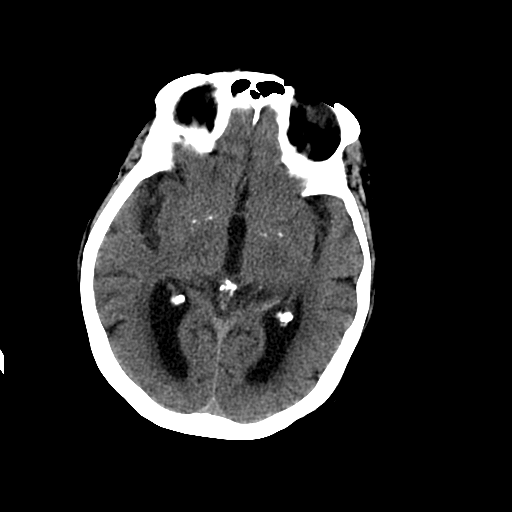
[im 16/32  brain]
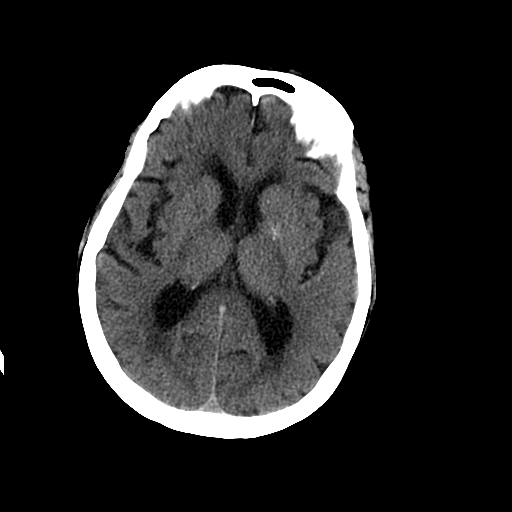
[im 18/32  brain]
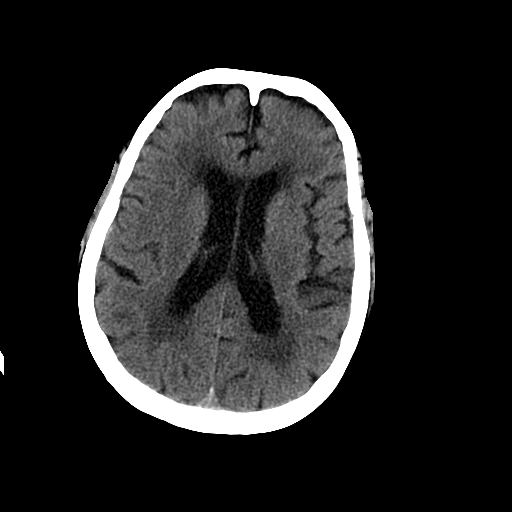
[im 20/32  brain]
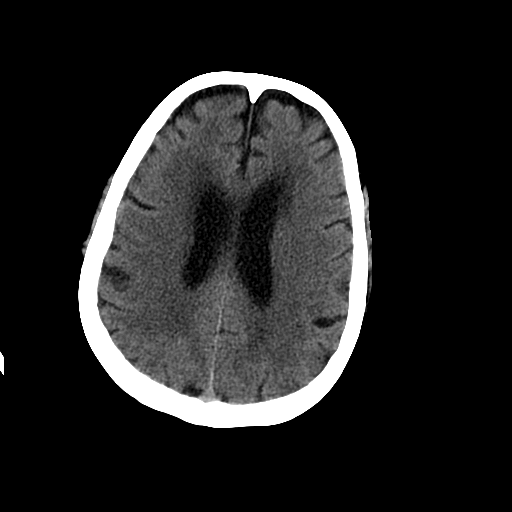
[im 20/32  bone]
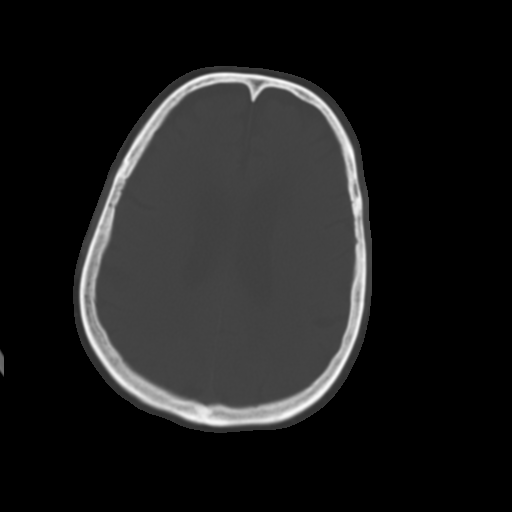
[im 23/32  brain]
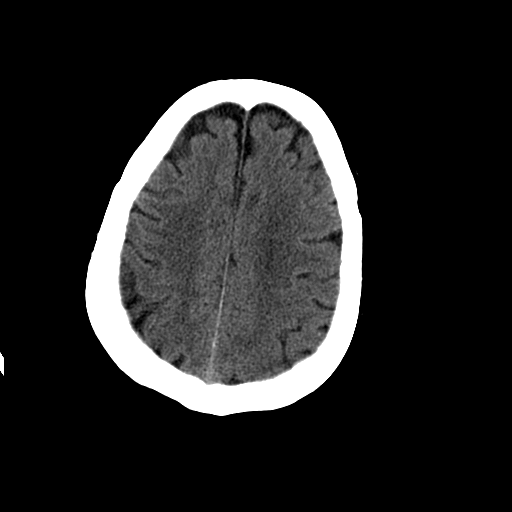
[im 25/32  brain]
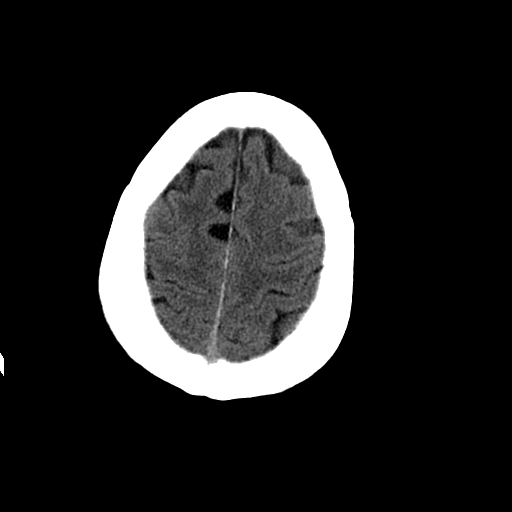
[im 27/32  brain]
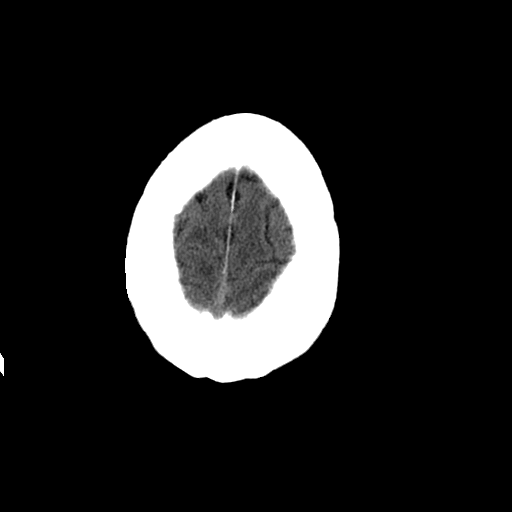
[im 29/32  brain]
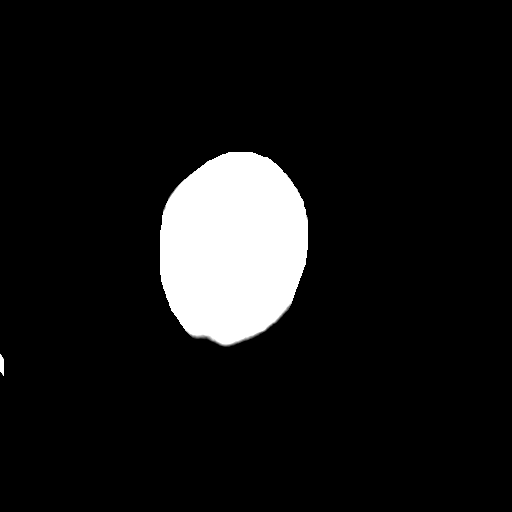
[im 29/32  bone]
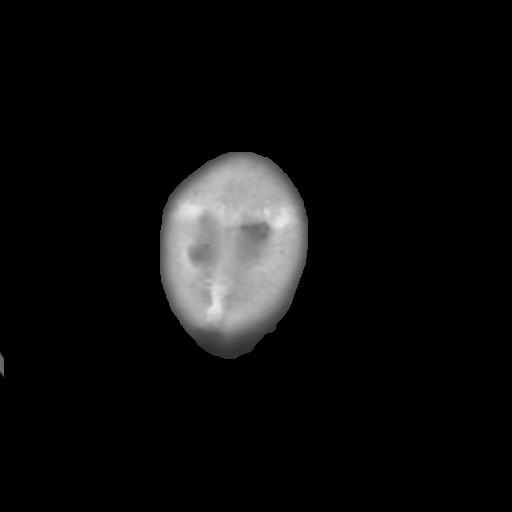

[Series 202: head w/o bone, idose (1) · axial · non-contrast · 0.49mm/px · z∈[+51,+96]mm · 3 of 32 slices shown]
[im 3/32  bone]
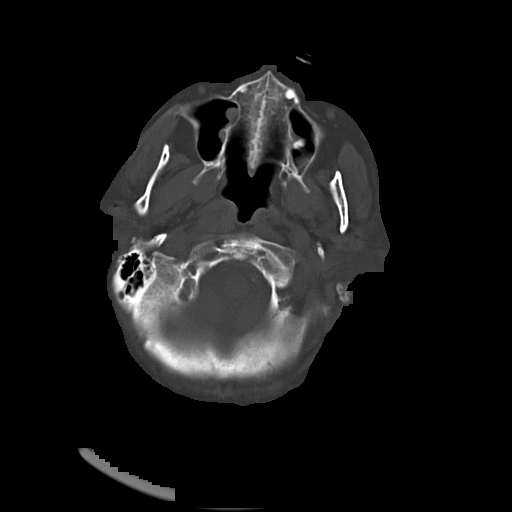
[im 7/32  bone]
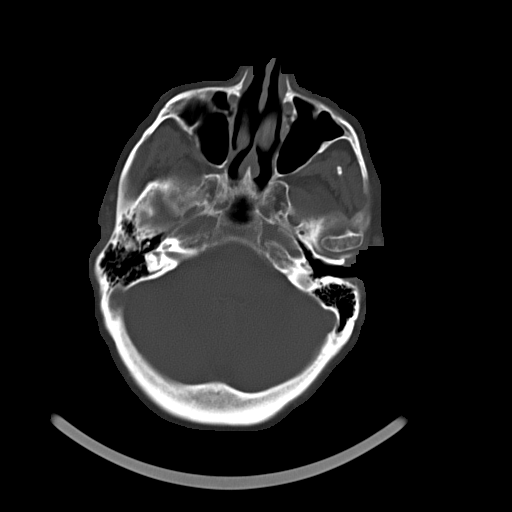
[im 12/32  bone]
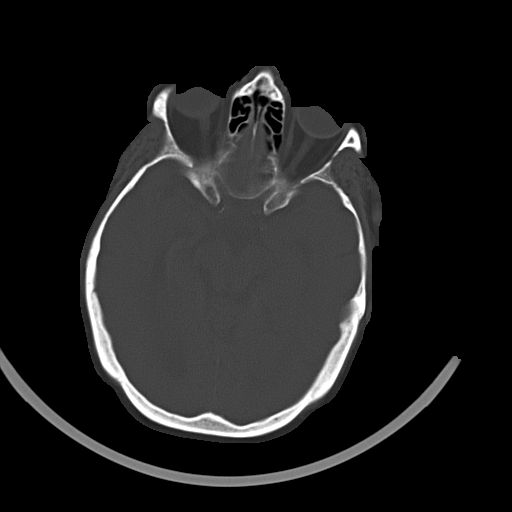

[16 of 30 positions shown; findings below may reference images not displayed]

FINDINGS: Chronic microvascular ischemic change is noted. No evidence of acute
intracranial abnormality including hemorrhage, infarct, mass lesion,
mass effect, midline shift or abnormal extra-axial fluid collection
is seen. No hydrocephalus or pneumocephalus. The calvarium is
intact. Imaged paranasal sinuses demonstrate a very small mucous
retention cyst or polyp in the right maxillary.
IMPRESSION: No acute abnormality.

Chronic microvascular ischemic change.
